# Patient Record
Sex: Female | Born: 1985
Health system: Southern US, Community
[De-identification: ages and names within clinical notes are randomized; demographics above are authoritative.]

## PROBLEM LIST (undated history)

## (undated) DIAGNOSIS — G43909 Migraine, unspecified, not intractable, without status migrainosus: Secondary | ICD-10-CM

## (undated) DIAGNOSIS — F3289 Other specified depressive episodes: Secondary | ICD-10-CM

## (undated) DIAGNOSIS — F319 Bipolar disorder, unspecified: Secondary | ICD-10-CM

## (undated) DIAGNOSIS — I10 Essential (primary) hypertension: Secondary | ICD-10-CM

## (undated) DIAGNOSIS — F17200 Nicotine dependence, unspecified, uncomplicated: Secondary | ICD-10-CM

## (undated) DIAGNOSIS — A6000 Herpesviral infection of urogenital system, unspecified: Secondary | ICD-10-CM

## (undated) DIAGNOSIS — F329 Major depressive disorder, single episode, unspecified: Secondary | ICD-10-CM

## (undated) DIAGNOSIS — Z9189 Other specified personal risk factors, not elsewhere classified: Secondary | ICD-10-CM

## (undated) HISTORY — DX: Herpesviral infection of urogenital system, unspecified: A60.00

## (undated) HISTORY — PX: NO PAST SURGERIES: SHX2092

## (undated) HISTORY — DX: Bipolar disorder, unspecified: F31.9

## (undated) HISTORY — DX: Other specified personal risk factors, not elsewhere classified: Z91.89

## (undated) HISTORY — DX: Other specified depressive episodes: F32.89

## (undated) HISTORY — DX: Nicotine dependence, unspecified, uncomplicated: F17.200

## (undated) HISTORY — DX: Major depressive disorder, single episode, unspecified: F32.9

## (undated) HISTORY — DX: Migraine, unspecified, not intractable, without status migrainosus: G43.909

## (undated) HISTORY — DX: Essential (primary) hypertension: I10

---
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
## 2004-04-05 ENCOUNTER — Emergency Department (HOSPITAL_COMMUNITY): Admission: EM | Admit: 2004-04-05 | Discharge: 2004-04-05 | Payer: Self-pay | Admitting: Family Medicine

## 2004-11-02 ENCOUNTER — Emergency Department (HOSPITAL_COMMUNITY): Admission: EM | Admit: 2004-11-02 | Discharge: 2004-11-02 | Payer: Self-pay | Admitting: Emergency Medicine

## 2005-02-05 ENCOUNTER — Emergency Department (HOSPITAL_COMMUNITY): Admission: EM | Admit: 2005-02-05 | Discharge: 2005-02-05 | Payer: Self-pay | Admitting: Emergency Medicine

## 2005-11-08 ENCOUNTER — Emergency Department (HOSPITAL_COMMUNITY): Admission: EM | Admit: 2005-11-08 | Discharge: 2005-11-08 | Payer: Self-pay | Admitting: Family Medicine

## 2006-10-12 IMAGING — CR DG FINGER LITTLE 2+V*L*
3 series · 3 of 3 positions shown · non-contrast
Comparison: none

CLINICAL DATA: Trauma

Left little finger three-view:
Fracture at the dorsal aspect of the base distal phalanx, distracted
approximately 1 mm. There is intra-articular extension of the fracture line with
minimal step-off deformity. No other bone abnormalities are seen.

[view not recorded (1 of 3)]
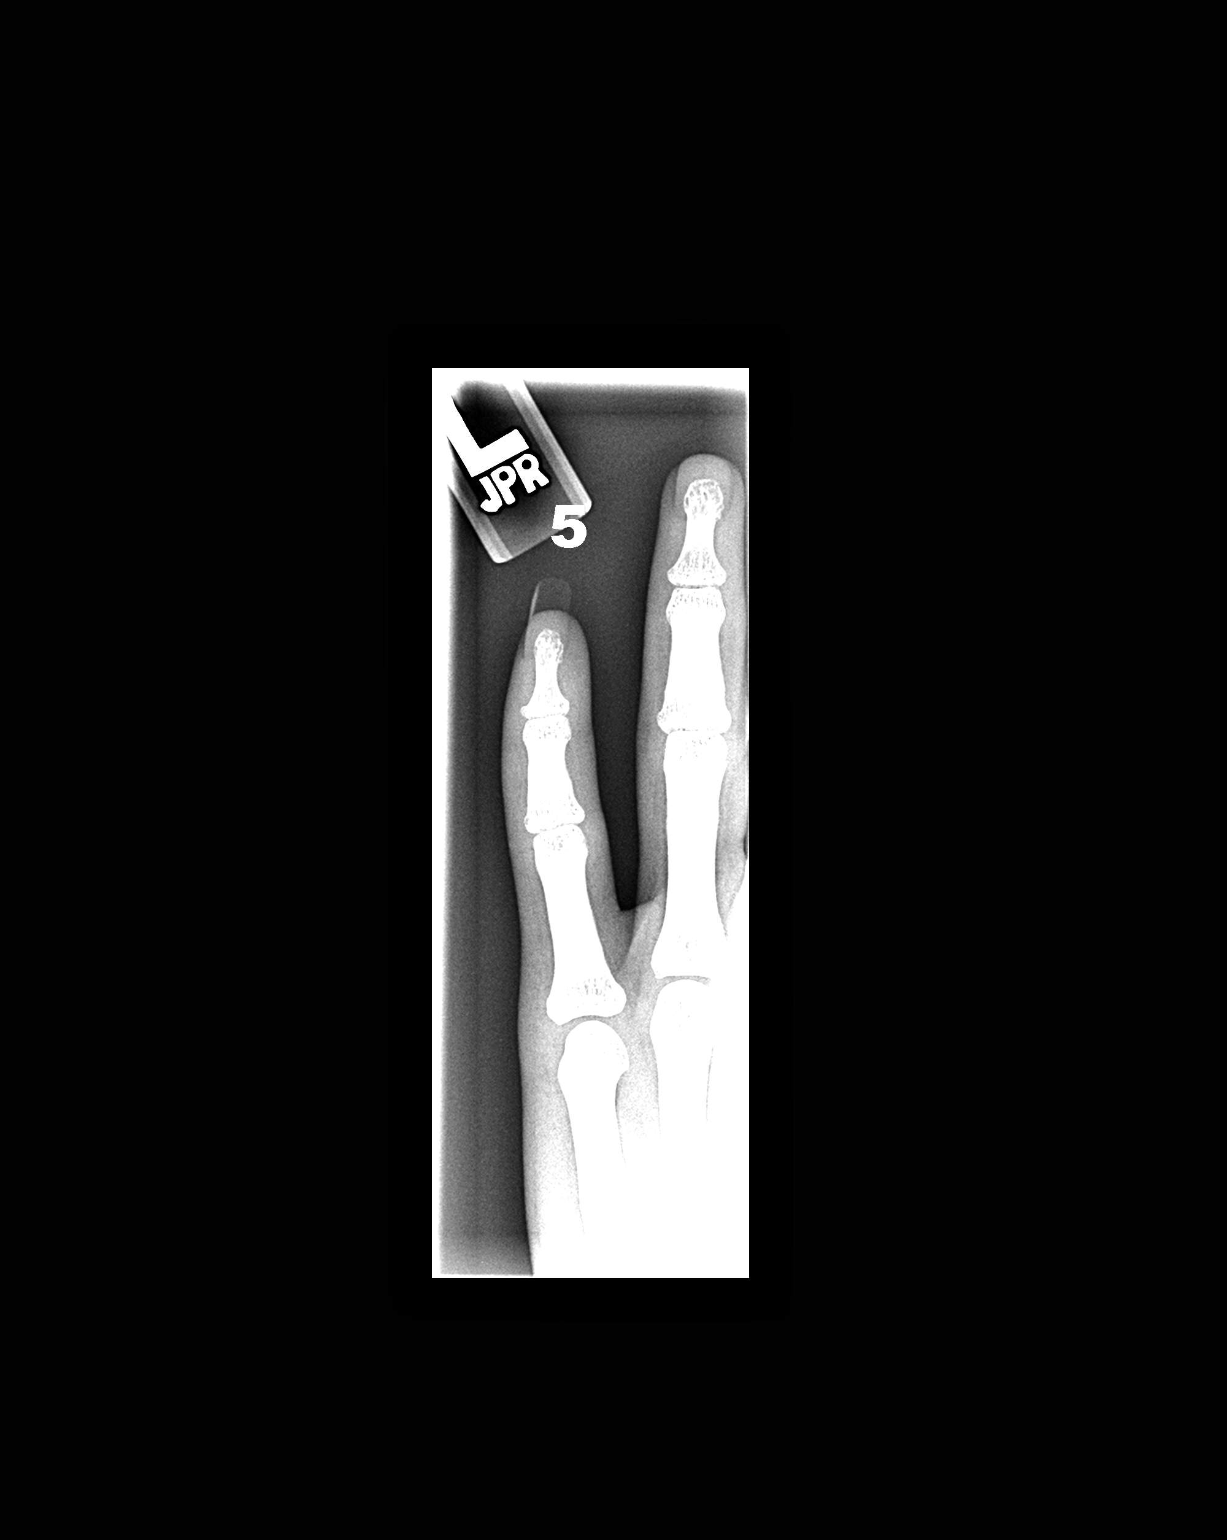

[view not recorded (2 of 3)]
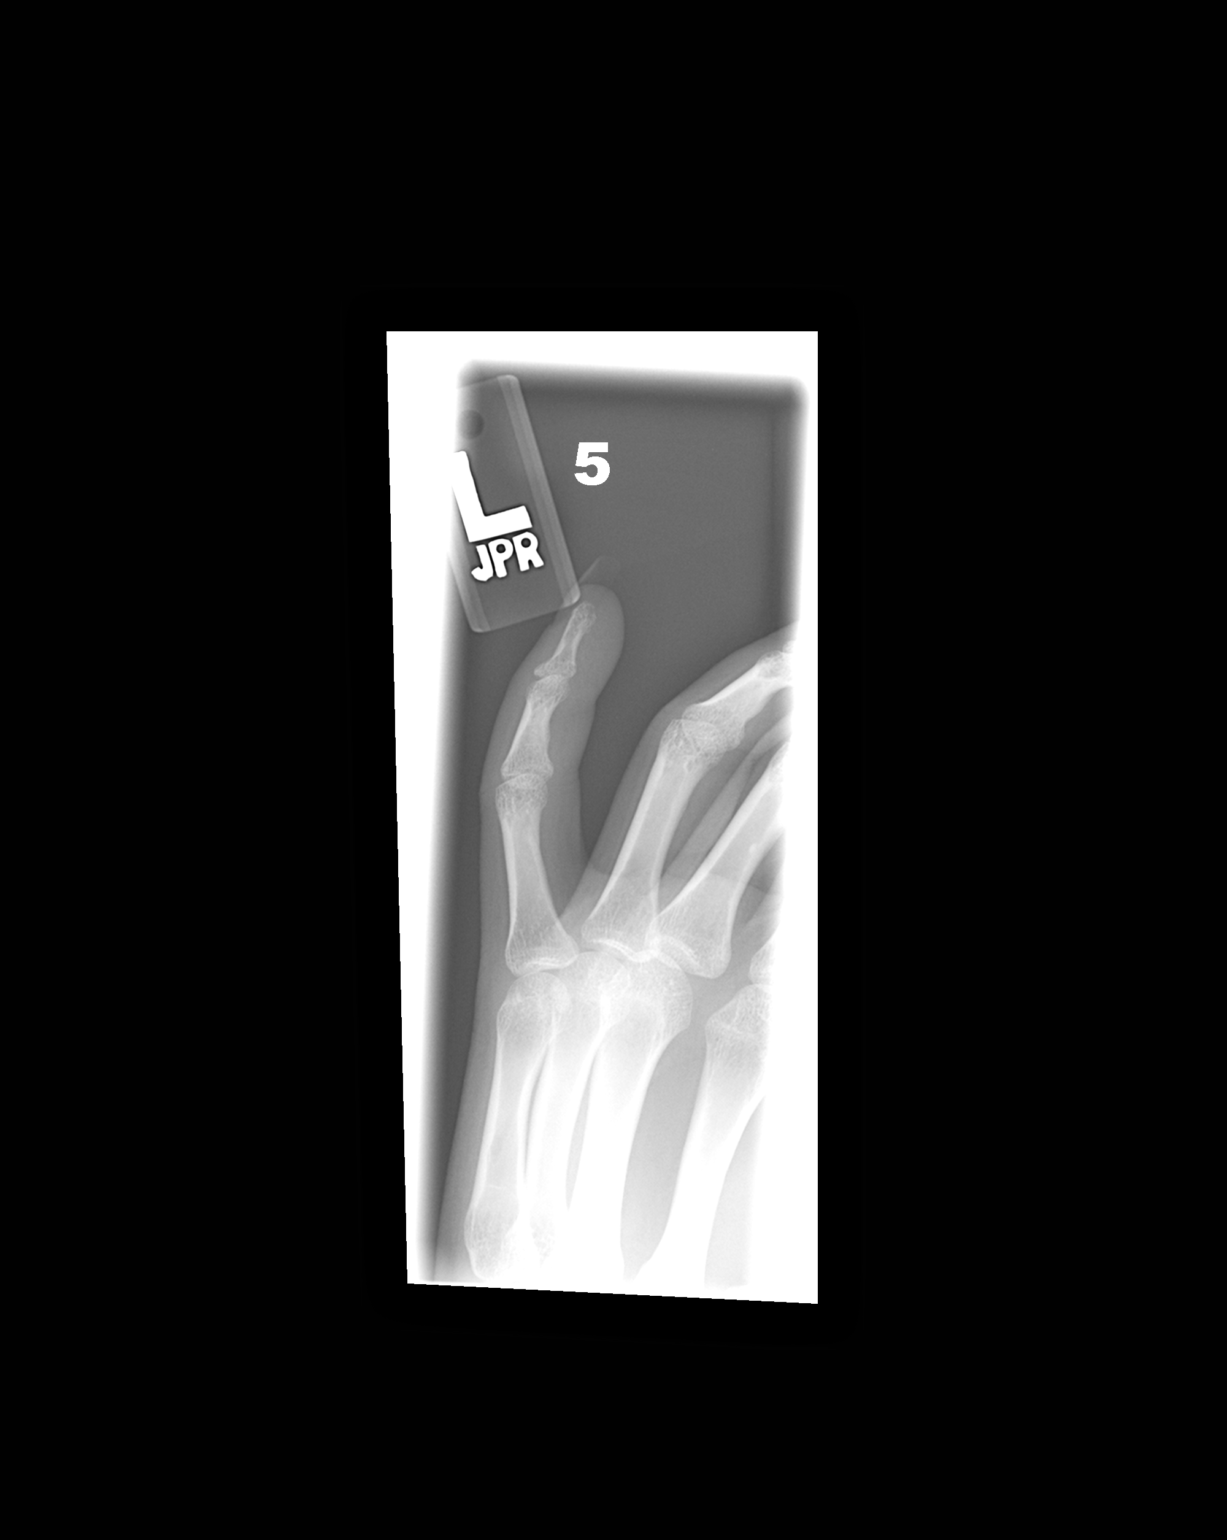

[view not recorded (3 of 3)]
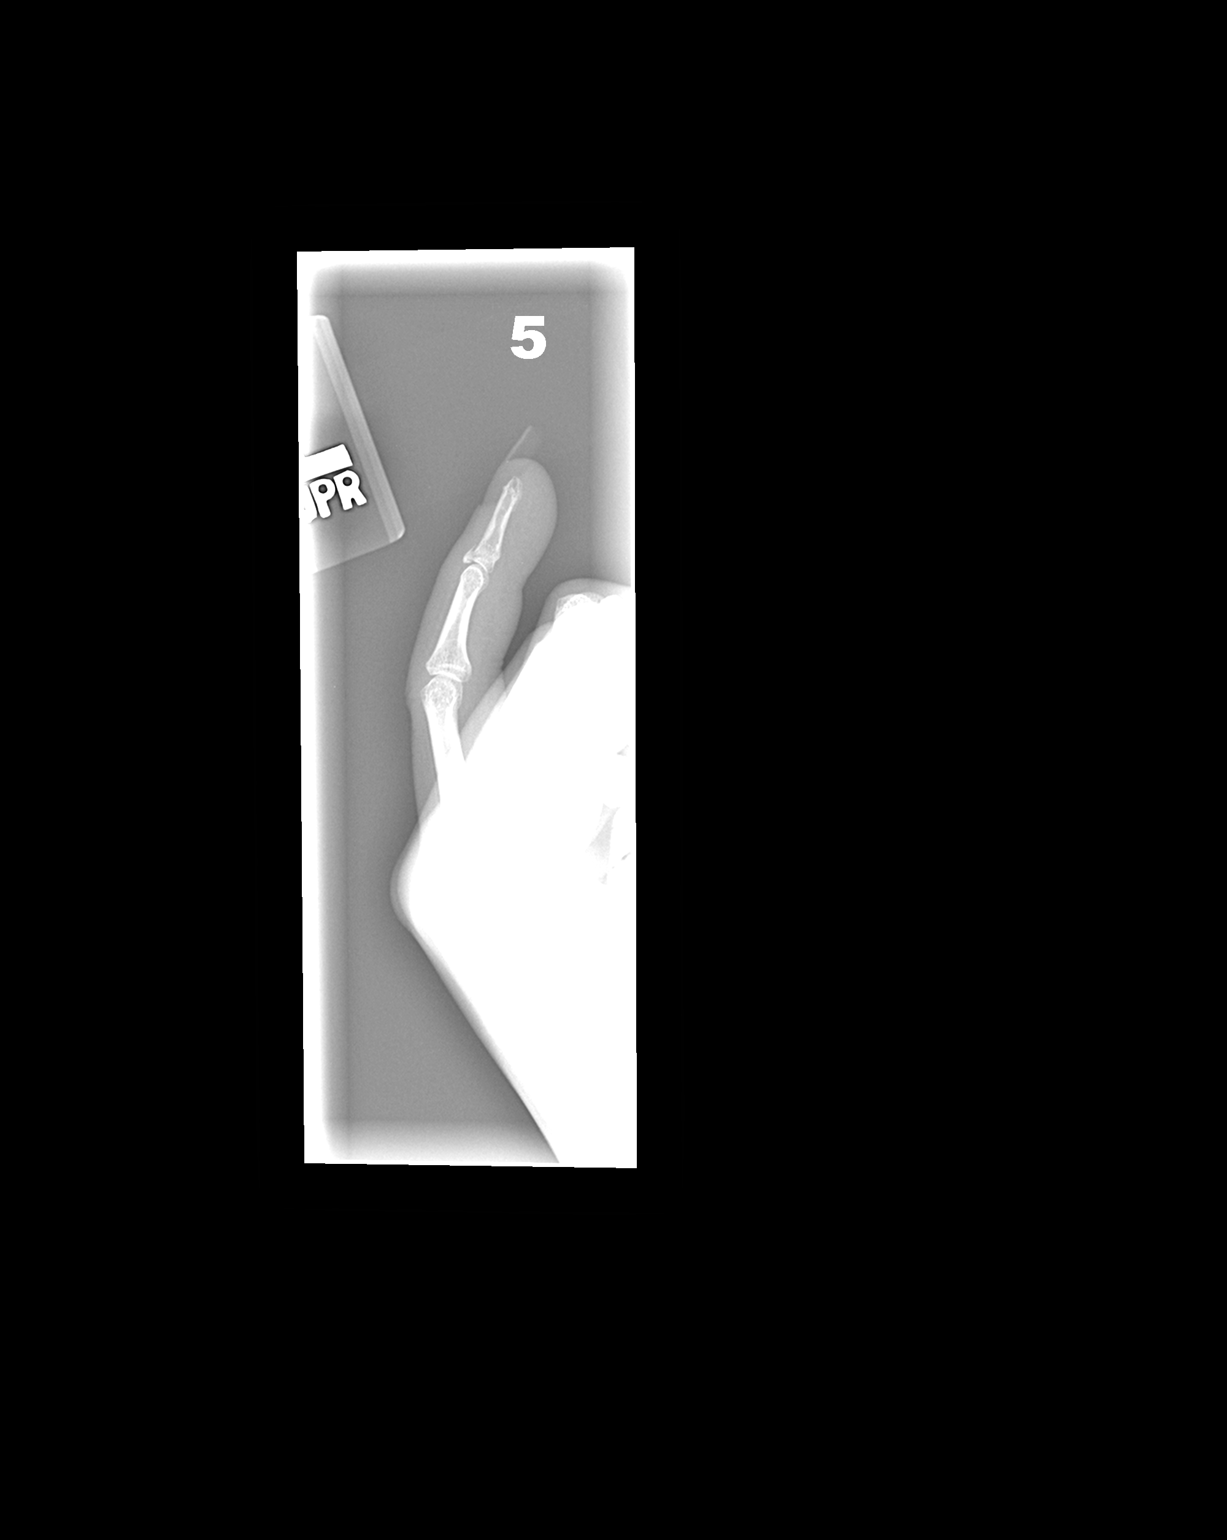

[3 of 3 positions shown; findings below may reference images not displayed]

IMPRESSION: 1. Interarticular fracture, dorsal aspect base distal phalanx left little finger

## 2006-11-01 ENCOUNTER — Emergency Department (HOSPITAL_COMMUNITY): Admission: EM | Admit: 2006-11-01 | Discharge: 2006-11-01 | Payer: Self-pay | Admitting: Family Medicine

## 2006-11-04 ENCOUNTER — Emergency Department (HOSPITAL_COMMUNITY): Admission: EM | Admit: 2006-11-04 | Discharge: 2006-11-04 | Payer: Self-pay | Admitting: Family Medicine

## 2007-02-01 ENCOUNTER — Emergency Department (HOSPITAL_COMMUNITY): Admission: EM | Admit: 2007-02-01 | Discharge: 2007-02-01 | Payer: Self-pay | Admitting: Family Medicine

## 2007-10-11 ENCOUNTER — Emergency Department (HOSPITAL_COMMUNITY): Admission: EM | Admit: 2007-10-11 | Discharge: 2007-10-11 | Payer: Self-pay | Admitting: Emergency Medicine

## 2008-11-14 ENCOUNTER — Other Ambulatory Visit: Admission: RE | Admit: 2008-11-14 | Discharge: 2008-11-14 | Payer: Self-pay | Admitting: Obstetrics and Gynecology

## 2008-11-14 ENCOUNTER — Encounter: Payer: Self-pay | Admitting: Internal Medicine

## 2008-11-14 LAB — CONVERTED CEMR LAB: Pap Smear: NEGATIVE

## 2009-09-11 ENCOUNTER — Ambulatory Visit: Payer: Self-pay | Admitting: Internal Medicine

## 2009-09-11 DIAGNOSIS — A6000 Herpesviral infection of urogenital system, unspecified: Secondary | ICD-10-CM | POA: Insufficient documentation

## 2009-09-11 DIAGNOSIS — I10 Essential (primary) hypertension: Secondary | ICD-10-CM | POA: Insufficient documentation

## 2009-09-11 DIAGNOSIS — F17200 Nicotine dependence, unspecified, uncomplicated: Secondary | ICD-10-CM | POA: Insufficient documentation

## 2009-09-11 DIAGNOSIS — Z9189 Other specified personal risk factors, not elsewhere classified: Secondary | ICD-10-CM | POA: Insufficient documentation

## 2009-09-11 DIAGNOSIS — F319 Bipolar disorder, unspecified: Secondary | ICD-10-CM | POA: Insufficient documentation

## 2009-09-11 DIAGNOSIS — F329 Major depressive disorder, single episode, unspecified: Secondary | ICD-10-CM | POA: Insufficient documentation

## 2009-09-11 DIAGNOSIS — F3289 Other specified depressive episodes: Secondary | ICD-10-CM | POA: Insufficient documentation

## 2009-09-11 DIAGNOSIS — G43909 Migraine, unspecified, not intractable, without status migrainosus: Secondary | ICD-10-CM | POA: Insufficient documentation

## 2009-09-17 ENCOUNTER — Encounter: Payer: Self-pay | Admitting: Internal Medicine

## 2009-09-17 LAB — CONVERTED CEMR LAB
ALT: 18 units/L (ref 0–35)
AST: 24 units/L (ref 0–37)
Albumin: 4.2 g/dL (ref 3.5–5.2)
Alkaline Phosphatase: 43 units/L (ref 39–117)
BUN: 11 mg/dL (ref 6–23)
Basophils Absolute: 0 10*3/uL (ref 0.0–0.1)
Basophils Relative: 0.1 % (ref 0.0–3.0)
Bilirubin, Direct: 0.1 mg/dL (ref 0.0–0.3)
CO2: 28 meq/L (ref 19–32)
Calcium: 9.3 mg/dL (ref 8.4–10.5)
Chloride: 109 meq/L (ref 96–112)
Cholesterol: 166 mg/dL (ref 0–200)
Creatinine, Ser: 0.9 mg/dL (ref 0.4–1.2)
Eosinophils Absolute: 0.1 10*3/uL (ref 0.0–0.7)
Eosinophils Relative: 1.1 % (ref 0.0–5.0)
GFR calc non Af Amer: 98.65 mL/min (ref >60)
Glucose, Bld: 84 mg/dL (ref 70–99)
HCT: 41.3 % (ref 36.0–46.0)
HDL: 45.8 mg/dL (ref >39.00)
Hemoglobin: 14.1 g/dL (ref 12.0–15.0)
LDL Cholesterol: 110 mg/dL — ABNORMAL HIGH (ref 0–99)
Lymphocytes Relative: 40.1 % (ref 12.0–46.0)
Lymphs Abs: 2.5 10*3/uL (ref 0.7–4.0)
MCHC: 34.2 g/dL (ref 30.0–36.0)
MCV: 97 fL (ref 78.0–100.0)
Monocytes Absolute: 0.3 10*3/uL (ref 0.1–1.0)
Monocytes Relative: 4.7 % (ref 3.0–12.0)
Neutro Abs: 3.3 10*3/uL (ref 1.4–7.7)
Neutrophils Relative %: 54 % (ref 43.0–77.0)
Platelets: 125 10*3/uL — ABNORMAL LOW (ref 150.0–400.0)
Potassium: 4.1 meq/L (ref 3.5–5.1)
RBC: 4.26 M/uL (ref 3.87–5.11)
RDW: 13.2 % (ref 11.5–14.6)
Sodium: 145 meq/L (ref 135–145)
TSH: 0.61 microintl units/mL (ref 0.35–5.50)
Total Bilirubin: 0.9 mg/dL (ref 0.3–1.2)
Total CHOL/HDL Ratio: 4
Total Protein: 6.8 g/dL (ref 6.0–8.3)
Triglycerides: 51 mg/dL (ref 0.0–149.0)
VLDL: 10.2 mg/dL (ref 0.0–40.0)
WBC: 6.2 10*3/uL (ref 4.5–10.5)

## 2009-10-24 ENCOUNTER — Other Ambulatory Visit: Admission: RE | Admit: 2009-10-24 | Discharge: 2009-10-24 | Payer: Self-pay | Admitting: Obstetrics and Gynecology

## 2009-10-24 ENCOUNTER — Emergency Department (HOSPITAL_COMMUNITY): Admission: EM | Admit: 2009-10-24 | Discharge: 2009-10-24 | Payer: Self-pay | Admitting: Family Medicine

## 2010-03-12 ENCOUNTER — Ambulatory Visit: Payer: Self-pay | Admitting: Internal Medicine

## 2010-03-12 DIAGNOSIS — N644 Mastodynia: Secondary | ICD-10-CM | POA: Insufficient documentation

## 2010-03-13 ENCOUNTER — Encounter: Admission: RE | Admit: 2010-03-13 | Discharge: 2010-03-13 | Payer: Self-pay | Admitting: Internal Medicine

## 2010-05-03 ENCOUNTER — Telehealth: Payer: Self-pay | Admitting: Internal Medicine

## 2010-07-01 ENCOUNTER — Emergency Department (HOSPITAL_COMMUNITY): Admission: EM | Admit: 2010-07-01 | Discharge: 2010-07-01 | Payer: Self-pay | Admitting: Emergency Medicine

## 2010-10-29 ENCOUNTER — Telehealth (INDEPENDENT_AMBULATORY_CARE_PROVIDER_SITE_OTHER): Payer: Self-pay | Admitting: *Deleted

## 2010-11-04 ENCOUNTER — Emergency Department (HOSPITAL_COMMUNITY)
Admission: EM | Admit: 2010-11-04 | Discharge: 2010-11-04 | Payer: Self-pay | Source: Home / Self Care | Admitting: Emergency Medicine

## 2010-11-19 NOTE — Progress Notes (Signed)
Summary: BP reading  Phone Note Call from Patient Call back at Home Phone [phone]   Caller: Patient Summary of Call: Patient called requesting to know last couple of BP readings to see if she will qualify for insurance  premium discount through employer. Returned call to pt/ lmovm to call back Initial call taken by: Rock Nephew CMA,  May 03, 2010 4:15 PM  Follow-up for Phone Call        Patient notified and will fax form to my attn for her job.  Follow-up by: Lucious Groves CMA,  May 06, 2010 9:25 AM

## 2010-11-19 NOTE — Assessment & Plan Note (Signed)
Summary: R SORE BREAST   STC   Vital Signs:  Patient profile:   25 year old female Height:      62.5 inches (158.75 cm) Weight:      151.4 pounds (68.82 kg) O2 Sat:      96 % on Room air Temp:     99.1 degrees F (37.28 degrees C) oral Pulse rate:   105 / minute BP sitting:   118 / 68  (left arm) Cuff size:   regular  Vitals Entered By: Orlan Leavens (Mar 12, 2010 2:41 PM)  O2 Flow:  Room air CC:  (R) breast sore Is Patient Diabetic? No Pain Assessment Patient in pain? no        Primary Care Provider:  Newt Lukes MD  CC:   (R) breast sore.  History of Present Illness: c/o pain in right breast - a/w increase size of breast - ?big mass under the nipple region - onset pain 3 days ago - but inc size last 2-3 weeks - stopped depo shots 3 months ago - no redness or skin change - no discharge from nipple - no fever  Current Medications (verified): 1)  Lamictal 100 Mg Tabs (Lamotrigine) .... Take 1 By Mouth Qd 2)  Depo-Provera 150 Mg/ml Susp (Medroxyprogesterone Acetate) .... Every 3 Mos At Ob-Gyn  Allergies (verified): No Known Drug Allergies  Past History:  Past Medical History: Depression Bipolar I Hypertension   MD rooster: gyn -[facility] - [facility]  Family History: Family History Diabetes 1st degree relative (grandparent) Family History Hypertension (parent & grandparent) Stroke (grandparent) emotional illness (parent)  Review of Systems  The patient denies anorexia, fever, weight loss, weight gain, vision loss, dyspnea on exertion, headaches, and depression.    Physical Exam  General:  alert, well-developed, well-nourished, and cooperative to examination.    Breasts:  slightly R>L breast with 2cm palp firm mass beneath right areola (tender to palp) - mobile - no dimpling or discharge Lungs:  normal respiratory effort, no intercostal retractions or use of accessory muscles; normal breath sounds bilaterally - no  crackles and no wheezes.    Heart:  normal rate, regular rhythm, no murmur, and no rub. BLE without edema.   Impression & Recommendations:  Problem # 1:  BREAST PAIN, RIGHT (ICD-611.71) inc size and pain likely related to hormonal changes as now off depoprovera x 68mo but has not yet resumed period - breast tissue appearsnormal but pt concerned about the mass - will send for [place] to further eval and provide reassurance - no evidence for infx so no abx Orders: Misc. Referral (Misc. Ref)  Complete Medication List: 1)  Lamictal 100 Mg Tabs (Lamotrigine) .... Take 1 by mouth qd 2)  Depo-provera 150 Mg/ml Susp (Medroxyprogesterone acetate) .... Every 3 mos at ob-gyn  Patient Instructions: 1)  it was good to see you today. 2)  we'll make referral for breast ultarsound to look for mass or problem causing the right breast pain. Our office will contact you regarding this appointment once made.  3)  the swelling and pain may be related to being off Depo shots and resuming your own normal hormone cycle- 4)  use Tylenol or Ibuprofen for pain or comfort -

## 2010-11-21 NOTE — Progress Notes (Signed)
  Phone Note Other Incoming   Request: Send information Summary of Call: Request for records received from [place] [facility]. Request forwarded to [employer].

## 2010-11-29 ENCOUNTER — Telehealth (INDEPENDENT_AMBULATORY_CARE_PROVIDER_SITE_OTHER): Payer: Self-pay | Admitting: *Deleted

## 2010-12-05 NOTE — Progress Notes (Signed)
  Phone Note Other Incoming   Request: Send information Summary of Call: Request for records received from [place] [facility]. Request forwarded to [employer].  07/02/07 to 07/01/2010-[place]

## 2011-06-09 ENCOUNTER — Encounter: Payer: Self-pay | Admitting: Internal Medicine

## 2011-06-11 ENCOUNTER — Telehealth: Payer: Self-pay

## 2011-06-11 DIAGNOSIS — Z0000 Encounter for general adult medical examination without abnormal findings: Secondary | ICD-10-CM

## 2011-06-11 NOTE — Telephone Encounter (Signed)
Lab order change

## 2011-06-12 ENCOUNTER — Other Ambulatory Visit: Payer: Self-pay

## 2011-06-19 ENCOUNTER — Encounter: Payer: Self-pay | Admitting: Internal Medicine

## 2012-02-05 ENCOUNTER — Ambulatory Visit (INDEPENDENT_AMBULATORY_CARE_PROVIDER_SITE_OTHER): Payer: 59 | Admitting: Obstetrics and Gynecology

## 2012-02-05 ENCOUNTER — Encounter: Payer: Self-pay | Admitting: Obstetrics and Gynecology

## 2012-02-05 VITALS — BP 110/74 | Temp 98.5°F | Wt 159.0 lb

## 2012-02-05 DIAGNOSIS — N898 Other specified noninflammatory disorders of vagina: Secondary | ICD-10-CM | POA: Insufficient documentation

## 2012-02-05 DIAGNOSIS — A599 Trichomoniasis, unspecified: Secondary | ICD-10-CM | POA: Insufficient documentation

## 2012-02-05 DIAGNOSIS — A64 Unspecified sexually transmitted disease: Secondary | ICD-10-CM

## 2012-02-05 LAB — POCT WET PREP (WET MOUNT): Trichomonas Wet Prep HPF POC: POSITIVE

## 2012-02-05 MED ORDER — METRONIDAZOLE 500 MG PO TABS: 2000.0000 mg | ORAL_TABLET | Freq: Once | ORAL | Status: AC

## 2012-02-05 NOTE — Progress Notes (Signed)
Vaginal Discharge/Discomfort/Itching  Subjective:   Laurie Camacho is an 26 y.o. woman who presents c/o 4 wk hx of white, creamy vaginal discharge.  NO itching, irritation or odor.  Objective: discharge white, creamy and no odor Vaginal discharge: pH 5.0 Vaginal lesions:  none  Wet prep: normal epithelial cells, lactobacilli and trichomonads OSOM BV: not done OSOM Trichomonas:  not done  Assessment: Trichomonas  Plan: Additional Tests:GC and Chlamydia genprobes  Medications: metronidazole Counseling:  diagnosis, lab results, treatment options, follow up plan, return instructions and partner treatment offered and accepted Follow-up: August 2013 for aex  Brandilyn Nanninga P MD 02/05/2012 12:15 PM

## 2012-02-05 NOTE — Progress Notes (Signed)
Flagyl Rx printed didn't have pharmacy info called to [facility]. Patient aware.

## 2012-02-05 NOTE — Patient Instructions (Signed)
Trichomoniasis Trichomoniasis is an infection, caused by the Trichomonas organism, that affects both women and men. In women, the outer female genitalia and the vagina are affected. In men, the penis is mainly affected, but the prostate and other reproductive organs can also be involved. Trichomoniasis is a sexually transmitted disease (STD) and is most often passed to another person through sexual contact. The majority of people who get trichomoniasis, do so from a sexual encounter and are also at risk for other STDs, including gonorrhea and chlamydia, hepatitis B, and HIV. Trichomoniasis can increase the risk for HIV and pregnancy problems. SYMPTOMS   Abnormal gray-green, frothy vaginal discharge in women.   Itching and irritation of the area outside the vagina in women.   Penile discharge with or without pain in males.   Inflammation of the urethra (urethritis), causing painful urination.   Bleeding after sexual intercourse.  HOME CARE INSTRUCTIONS   Take all medication prescribed by your caregiver.   Take over-the-counter medication for itching or irritation as directed by your caregiver.   Do not have sexual intercourse while you have the infection.   Do not douche or wear tampons while you have the infection.   Discuss the infection with your partner, as your partner may have acquired the infection from you. Or, your partner may have been the person who transmitted the infection to you.   Have your sex partner examined and treated if necessary.   Practice safe, informed, and protected sex.   See your caregiver for other STD testing.  SEEK MEDICAL CARE IF:  You still have symptoms after you finish the medication.   You have an oral temperature above 102 F (38.9 C).   You develop belly (abdominal) pain.   You have pain when you urinate.   You have bleeding after sexual intercourse.   You develop a rash.   The medication makes you sick or throw up (vomit).  Document  Released: 11/13/2004 Document Revised: 09/25/2011 Document Reviewed: 04/27/2009 Exit[employer] Patient Information 2012 [employer]. 

## 2012-02-06 LAB — GC/CHLAMYDIA PROBE AMP, GENITAL: Chlamydia, DNA Probe: NEGATIVE

## 2012-02-16 ENCOUNTER — Telehealth: Payer: Self-pay

## 2012-02-16 NOTE — Telephone Encounter (Signed)
Tc from pt. Req test results. Told pt GC/CT-both neg. Pt voices understanding.

## 2012-02-17 ENCOUNTER — Encounter: Payer: Self-pay | Admitting: Obstetrics and Gynecology

## 2012-02-18 ENCOUNTER — Telehealth: Payer: Self-pay | Admitting: Obstetrics and Gynecology

## 2012-02-18 NOTE — Telephone Encounter (Signed)
Pc from pt. Wanted to touch base with pt to make sure ATB's was received from last ov due to new e-pres system. The rx printed out that day. Pt states,"received medicine,". Pt will sched TOC x3wks from taking ATB's.

## 2012-02-18 NOTE — Telephone Encounter (Signed)
Lm on vm to cb per rx.

## 2012-02-18 NOTE — Telephone Encounter (Signed)
Routed to chandra 

## 2012-03-18 ENCOUNTER — Encounter: Payer: 59 | Admitting: Obstetrics and Gynecology

## 2012-04-21 ENCOUNTER — Telehealth: Payer: Self-pay | Admitting: *Deleted

## 2012-04-21 DIAGNOSIS — Z0000 Encounter for general adult medical examination without abnormal findings: Secondary | ICD-10-CM

## 2012-04-21 NOTE — Telephone Encounter (Signed)
Message copied by Deatra James on Wed Apr 21, 2012  4:28 PM ------      Message from: Burnett Harry      Created: Wed Apr 21, 2012  4:13 PM      Regarding: CPE  05/10/2012       [facility]

## 2012-04-21 NOTE — Telephone Encounter (Signed)
Received staff msg pt made cpx 05/10/12. Need labs entered... 04/21/12@4 :28pm/LB

## 2012-05-05 ENCOUNTER — Ambulatory Visit (INDEPENDENT_AMBULATORY_CARE_PROVIDER_SITE_OTHER): Payer: 59 | Admitting: Obstetrics and Gynecology

## 2012-05-05 ENCOUNTER — Encounter: Payer: Self-pay | Admitting: Obstetrics and Gynecology

## 2012-05-05 VITALS — BP 118/76 | Temp 99.0°F | Ht 62.5 in | Wt 165.0 lb

## 2012-05-05 DIAGNOSIS — Z309 Encounter for contraceptive management, unspecified: Secondary | ICD-10-CM

## 2012-05-05 DIAGNOSIS — IMO0001 Reserved for inherently not codable concepts without codable children: Secondary | ICD-10-CM

## 2012-05-05 DIAGNOSIS — N898 Other specified noninflammatory disorders of vagina: Secondary | ICD-10-CM

## 2012-05-05 LAB — POCT WET PREP (WET MOUNT)
Clue Cells Wet Prep Whiff POC: POSITIVE
pH: 5.5

## 2012-05-05 MED ORDER — MEDROXYPROGESTERONE ACETATE 150 MG/ML IM SUSP: 150.0000 mg | INTRAMUSCULAR | Status: DC

## 2012-05-05 NOTE — Progress Notes (Signed)
SUBJECTIVE: Pt here today for TOC of trich. Pt c/o white vaginal d/c. No odor or irritation. Pt admits to missing bcp's frequently and wants to consider Depo Provera again for birth control. Denies unprotected IC. Last BCPs about 2 weeks ago.  No IC since  OBJECTIVE: BP 118/76  Temp 99 F (37.2 C)  Ht 5' 2.5" (1.588 m)  Wt 165 lb (74.844 kg)  BMI 29.70 kg/m2  LMP 04/21/2012  Pelvic: Vulva: NL  Vagina: White Discharge Wet Prep: PH5.0   No trich  IMPRESSION: No evidence of trich Needs BC  RECOMMENDATIONS: Pt wants to restart Depo Provera, script sent to pharmacy to start ASAP. No IC until then. FU for aex

## 2012-05-10 ENCOUNTER — Other Ambulatory Visit (INDEPENDENT_AMBULATORY_CARE_PROVIDER_SITE_OTHER): Payer: 59

## 2012-05-10 ENCOUNTER — Ambulatory Visit (INDEPENDENT_AMBULATORY_CARE_PROVIDER_SITE_OTHER): Payer: 59 | Admitting: Internal Medicine

## 2012-05-10 ENCOUNTER — Encounter: Payer: Self-pay | Admitting: Internal Medicine

## 2012-05-10 VITALS — BP 110/72 | HR 86 | Temp 98.6°F | Ht 62.5 in | Wt 164.0 lb

## 2012-05-10 DIAGNOSIS — F17200 Nicotine dependence, unspecified, uncomplicated: Secondary | ICD-10-CM

## 2012-05-10 DIAGNOSIS — Z3009 Encounter for other general counseling and advice on contraception: Secondary | ICD-10-CM

## 2012-05-10 DIAGNOSIS — Z0000 Encounter for general adult medical examination without abnormal findings: Secondary | ICD-10-CM

## 2012-05-10 LAB — CBC WITH DIFFERENTIAL/PLATELET
Basophils Relative: 0.4 % (ref 0.0–3.0)
Eosinophils Absolute: 0.2 10*3/uL (ref 0.0–0.7)
Eosinophils Relative: 2.2 % (ref 0.0–5.0)
Lymphocytes Relative: 21.7 % (ref 12.0–46.0)
Monocytes Relative: 4.5 % (ref 3.0–12.0)
Neutrophils Relative %: 71.2 % (ref 43.0–77.0)
RBC: 4.58 Mil/uL (ref 3.87–5.11)
WBC: 8.8 10*3/uL (ref 4.5–10.5)

## 2012-05-10 LAB — URINALYSIS, ROUTINE W REFLEX MICROSCOPIC
Ketones, ur: NEGATIVE
Specific Gravity, Urine: 1.025 (ref 1.000–1.030)
Urine Glucose: NEGATIVE
pH: 6 (ref 5.0–8.0)

## 2012-05-10 LAB — BASIC METABOLIC PANEL
BUN: 13 mg/dL (ref 6–23)
Chloride: 108 mEq/L (ref 96–112)
Creatinine, Ser: 0.7 mg/dL (ref 0.4–1.2)
Glucose, Bld: 91 mg/dL (ref 70–99)
Potassium: 4.1 mEq/L (ref 3.5–5.1)

## 2012-05-10 LAB — HEPATIC FUNCTION PANEL
Alkaline Phosphatase: 58 U/L (ref 39–117)
Bilirubin, Direct: 0.1 mg/dL (ref 0.0–0.3)
Total Protein: 7.1 g/dL (ref 6.0–8.3)

## 2012-05-10 LAB — LIPID PANEL
Cholesterol: 213 mg/dL — ABNORMAL HIGH (ref 0–200)
HDL: 55.2 mg/dL (ref >39.00)

## 2012-05-10 MED ORDER — MEDROXYPROGESTERONE ACETATE 150 MG/ML IM SUSP
150.0000 mg | Freq: Once | INTRAMUSCULAR | Status: AC
  Administered 2012-05-10: 150 mg via INTRAMUSCULAR

## 2012-05-10 NOTE — Progress Notes (Unsigned)
Next Depo due 08-01-2012 

## 2012-05-10 NOTE — Patient Instructions (Signed)
It was good to see you today. Health Maintenance reviewed - please schedule nurse visit for your Tetanus booster update - all other recommended immunizations and age-appropriate screenings are up-to-date.  Test(s) ordered today. Your results will be called to you after review (48-72hours after test completion). If any changes need to be made, you will be notified at that time. We will complete your employer health form after review of lab results and fax as requested Work on lifestyle changes as discussed (low fat, low carb, increased protein diet; improved exercise efforts; weight loss) to control sugar, blood pressure and cholesterol levels and/or reduce risk of developing other medical problems. Look into Limit[employer]cy or other type of food journal to assist you in this process. Continue to think about giving up cigarettes! Use nicotine gum, nicotine patches or electronic cigarettes. If you're interested in medication to help you quit, please call  - let me know how I can help! Please schedule followup in 1-2 years for medical physical and labs, call sooner if problems.

## 2012-05-10 NOTE — Assessment & Plan Note (Signed)
5 minutes today spent counseling patient on unhealthy effects of continued tobacco abuse and encouragement of cessation including medical options available to help the patient quit smoking. She has nicotine patches at home -she is encouraged to use these

## 2012-05-10 NOTE — Progress Notes (Signed)
Subjective:    Patient ID: Laurie Camacho, female    DOB: 12-20-1985, 26 y.o.   MRN: [id number]  HPI  patient is here today for annual physical. Patient feels well and has no complaints. Needs form completed for employer's wellness screening  Past Medical History  Diagnosis Date  . BIPOLAR AFFECTIVE DISORDER   . CHICKENPOX, HX OF   . DEPRESSION   . GENITAL HERPES   . HYPERTENSION   . MIGRAINE HEADACHE   . SMOKER    Family History  Problem Relation Age of Onset  . Diabetes Other   . Hypertension Other   . Stroke Other    History  Substance Use Topics  . Smoking status: Current Everyday Smoker -- 1.0 packs/day    Types: Cigarettes  . Smokeless tobacco: Current User   Comment: Single- lives with domestic partner. 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 Alcohol Use: No     Review of Systems Constitutional: Negative for fever or unexpected weight change.  Respiratory: Negative for cough and shortness of breath.   Cardiovascular: Negative for chest pain or palpitations.  Gastrointestinal: Negative for abdominal pain, no bowel changes.  Musculoskeletal: Negative for gait problem or joint swelling.  Skin: Negative for rash.  Neurological: Negative for dizziness or headache.  No other specific complaints in a complete review of systems (except as listed in HPI above).     Objective:   Physical Exam BP 110/72  Pulse 86  Temp 98.6 F (37 C) (Oral)  Ht 5' 2.5" (1.588 m)  Wt 164 lb (74.39 kg)  BMI 29.52 kg/m2  SpO2 95%  LMP 04/21/2012 Wt Readings from Last 3 Encounters:  05/10/12 164 lb (74.39 kg)  05/05/12 165 lb (74.844 kg)  02/05/12 159 lb (72.122 kg)   Constitutional: She is overweight, but appears well-developed and well-nourished. No distress.  HENT: Head: Normocephalic and atraumatic. Ears: B TMs ok, no erythema or effusion; Nose: Nose normal. Mouth/Throat: Oropharynx is clear and moist. No oropharyngeal exudate.  Eyes: Conjunctivae and EOM are normal.  Pupils are equal, round, and reactive to light. No scleral icterus.  Neck: Normal range of motion. Neck supple. No JVD present. No thyromegaly present.  Cardiovascular: Normal rate, regular rhythm and normal heart sounds.  No murmur heard. No BLE edema. Pulmonary/Chest: Effort normal and breath sounds normal. No respiratory distress. She has no wheezes.  Abdominal: Soft. Bowel sounds are normal. She exhibits no distension. There is no tenderness. no masses GU: defer to gyn Musculoskeletal:  R foot with mild bunion deformity - Normal range of motion, no joint effusions. No other gross deformities Neurological: She is alert and oriented to person, place, and time. No cranial nerve deficit. Coordination normal.  Skin: various tattoos - Skin is warm and dry. No rash noted. No erythema.  Psychiatric: She has a normal mood and affect. Her behavior is normal. Judgment and thought content normal.   Lab Results  Component Value Date   WBC 6.2 09/11/2009   HGB 14.1 09/11/2009   HCT 41.3 09/11/2009   PLT 125.0* 09/11/2009   GLUCOSE 84 09/11/2009   CHOL 166 09/11/2009   TRIG 51.0 09/11/2009   HDL 45.80 09/11/2009   LDLCALC 110* 09/11/2009   ALT 18 09/11/2009   AST 24 09/11/2009   NA 145 09/11/2009   K 4.1 09/11/2009   CL 109 09/11/2009   CREATININE 0.9 09/11/2009   BUN 11 09/11/2009   CO2 28 09/11/2009   TSH 0.61 09/11/2009  Assessment & Plan:  CPX/v70.0 - Patient has been counseled on age-appropriate routine health concerns for screening and prevention. These are reviewed and up-to-date. Immunizations are up-to-date or declined. Labs ordered and will be reviewed. Form for employer to be completed as requested after lab review

## 2012-05-24 ENCOUNTER — Telehealth: Payer: Self-pay | Admitting: Obstetrics and Gynecology

## 2012-05-24 NOTE — Telephone Encounter (Signed)
Dr. Haygood/clarification on treatment.

## 2012-05-27 MED ORDER — FLUCONAZOLE 150 MG PO TABS: 150.0000 mg | ORAL_TABLET | Freq: Once | ORAL | Status: AC

## 2012-05-27 MED ORDER — METRONIDAZOLE 0.75 % VA GEL: VAGINAL | Status: DC

## 2012-05-27 NOTE — Telephone Encounter (Signed)
Tc to pt per vph recs. Pt voices understanding and wants to try meds offered. Rx for Metrogel and Diflucan on file e-pres to pharm on file.

## 2012-05-27 NOTE — Telephone Encounter (Signed)
No treatment was recommended since pt did not complain of sx referrable to BV.  She was listed as needing a TOC for trichomonas and both tests were negative for that.  If she is having sx (ie discharge with odor) we will send Metrogel for 7 days and a single Diflucan to be used after the last day of Metroget. BV can be a recurrent problem so it is not always treated if there are no sx except in pregnancy.

## 2012-06-09 ENCOUNTER — Ambulatory Visit: Payer: 59 | Admitting: Obstetrics and Gynecology

## 2012-07-01 ENCOUNTER — Encounter: Payer: 59 | Admitting: Obstetrics and Gynecology

## 2012-07-15 ENCOUNTER — Telehealth: Payer: Self-pay | Admitting: Obstetrics and Gynecology

## 2012-07-15 NOTE — Telephone Encounter (Signed)
Spoke with pt rgd msg pt wants to have cholesterol checked advised pt to contact pcp pt voice understanding

## 2012-07-16 ENCOUNTER — Ambulatory Visit (INDEPENDENT_AMBULATORY_CARE_PROVIDER_SITE_OTHER): Payer: 59 | Admitting: Internal Medicine

## 2012-07-16 ENCOUNTER — Encounter: Payer: Self-pay | Admitting: Internal Medicine

## 2012-07-16 ENCOUNTER — Other Ambulatory Visit (INDEPENDENT_AMBULATORY_CARE_PROVIDER_SITE_OTHER): Payer: 59

## 2012-07-16 VITALS — BP 106/72 | HR 86 | Temp 98.5°F | Ht 62.5 in | Wt 160.0 lb

## 2012-07-16 DIAGNOSIS — E785 Hyperlipidemia, unspecified: Secondary | ICD-10-CM | POA: Insufficient documentation

## 2012-07-16 LAB — LIPID PANEL
LDL Cholesterol: 113 mg/dL — ABNORMAL HIGH (ref 0–99)
Total CHOL/HDL Ratio: 4
Triglycerides: 56 mg/dL (ref 0.0–149.0)

## 2012-07-16 NOTE — Progress Notes (Signed)
  Subjective:    Patient ID: Laurie Camacho, female    DOB: 12/08/1985, 26 y.o.   MRN: [id number]  HPI  Here for followup, needs completion of insurance forms and recheck cholesterol to see if LDL<130 Has changed diet and exercise - weight loss reviewed  Review of Systems  Constitutional: Negative for fatigue and unexpected weight change.  Respiratory: Negative for cough and shortness of breath.   Cardiovascular: Negative for chest pain and palpitations.       Objective:   Physical Exam BP 106/72  Pulse 86  Temp 98.5 F (36.9 C) (Oral)  Ht 5' 2.5" (1.588 m)  Wt 160 lb (72.576 kg)  BMI 28.80 kg/m2  SpO2 95% Wt Readings from Last 3 Encounters:  07/16/12 160 lb (72.576 kg)  05/10/12 164 lb (74.39 kg)  05/05/12 165 lb (74.844 kg)   Constitutional: She appears well-developed and well-nourished. No distress.  Cardiovascular: Normal rate, regular rhythm and normal heart sounds.  No murmur heard. No BLE edema. Pulmonary/Chest: Effort normal and breath sounds normal. No respiratory distress. She has no wheezes.  Psychiatric: She has a normal mood and affect. Her behavior is normal. Judgment and thought content normal.   Lab Results  Component Value Date   WBC 8.8 05/10/2012   HGB 14.1 05/10/2012   HCT 42.4 05/10/2012   PLT 173.0 05/10/2012   GLUCOSE 91 05/10/2012   CHOL 213* 05/10/2012   TRIG 57.0 05/10/2012   HDL 55.20 05/10/2012   LDLDIRECT 142.0 05/10/2012   LDLCALC 110* 09/11/2009   ALT 13 05/10/2012   AST 15 05/10/2012   NA 139 05/10/2012   K 4.1 05/10/2012   CL 108 05/10/2012   CREATININE 0.7 05/10/2012   BUN 13 05/10/2012   CO2 24 05/10/2012   TSH 0.66 05/10/2012       Assessment & Plan:  See problem list. Medications and labs reviewed today.

## 2012-07-16 NOTE — Patient Instructions (Signed)
It was good to see you today. Test(s) ordered today. Your results will be called to you after review (48-72hours after test completion). If any changes need to be made, you will be notified at that time. We will complete your employer health form after review of lab results and fax as requested Continue to work on lifestyle changes as discussed (low fat, low carb, increased protein diet; improved exercise efforts; weight loss) to control sugar, blood pressure and cholesterol levels and/or reduce risk of developing other medical problems.  Continue to think about giving up cigarettes! Use nicotine gum, nicotine patches or electronic cigarettes. If you're interested in medication to help you quit, please call  - let me know how I can help! Please schedule followup in 1-2 years for medical physical and labs, call sooner if problems.

## 2012-07-16 NOTE — Assessment & Plan Note (Signed)
Will recheck LDL now to see if less than 130 following improvement in diet and exercise efforts Intentional weight loss of over 10 pounds reported Will complete insurance form after lab review

## 2012-07-22 ENCOUNTER — Telehealth: Payer: Self-pay | Admitting: Obstetrics and Gynecology

## 2012-07-22 NOTE — Telephone Encounter (Signed)
Tc to pt per telephone call. Told pt next Depo Provera due 08/01/12. Pt agrees.

## 2012-07-30 ENCOUNTER — Other Ambulatory Visit: Payer: 59

## 2012-08-02 ENCOUNTER — Other Ambulatory Visit: Payer: 59

## 2012-08-03 ENCOUNTER — Other Ambulatory Visit: Payer: Self-pay | Admitting: Obstetrics and Gynecology

## 2012-08-04 ENCOUNTER — Telehealth: Payer: Self-pay | Admitting: Obstetrics and Gynecology

## 2012-08-04 NOTE — Telephone Encounter (Signed)
TC to pt.  States is having recurrent vaginal irritation with possible infection.Was treated one month ago.  Sched for eval with SL 08/06/12 to accommocate pt's schedule.

## 2012-08-06 ENCOUNTER — Encounter: Payer: 59 | Admitting: Obstetrics and Gynecology

## 2012-08-06 ENCOUNTER — Other Ambulatory Visit (INDEPENDENT_AMBULATORY_CARE_PROVIDER_SITE_OTHER): Payer: 59

## 2012-08-06 DIAGNOSIS — Z3009 Encounter for other general counseling and advice on contraception: Secondary | ICD-10-CM

## 2012-08-06 MED ORDER — MEDROXYPROGESTERONE ACETATE 150 MG/ML IM SUSP
150.0000 mg | Freq: Once | INTRAMUSCULAR | Status: AC
  Administered 2012-08-06: 150 mg via INTRAMUSCULAR

## 2012-08-06 NOTE — Progress Notes (Unsigned)
Next Depo due 10-29-2011

## 2012-10-29 ENCOUNTER — Other Ambulatory Visit: Payer: 59

## 2012-11-03 ENCOUNTER — Other Ambulatory Visit: Payer: 59

## 2012-11-03 DIAGNOSIS — Z3009 Encounter for other general counseling and advice on contraception: Secondary | ICD-10-CM

## 2012-11-03 MED ORDER — MEDROXYPROGESTERONE ACETATE 150 MG/ML IM SUSP
150.0000 mg | Freq: Once | INTRAMUSCULAR | Status: AC
  Administered 2012-11-03: 150 mg via INTRAMUSCULAR

## 2012-11-03 NOTE — Progress Notes (Unsigned)
Next Depo due 01-25-2013

## 2012-11-23 ENCOUNTER — Encounter (HOSPITAL_COMMUNITY): Payer: Self-pay | Admitting: Emergency Medicine

## 2012-11-23 ENCOUNTER — Ambulatory Visit: Payer: 59 | Admitting: Obstetrics and Gynecology

## 2012-11-23 ENCOUNTER — Encounter: Payer: Self-pay | Admitting: Obstetrics and Gynecology

## 2012-11-23 ENCOUNTER — Emergency Department (HOSPITAL_COMMUNITY)
Admission: EM | Admit: 2012-11-23 | Discharge: 2012-11-23 | Disposition: A | Discharge status: Home / Self Care | Payer: 59 | Source: Home / Self Care | Attending: Family Medicine | Admitting: Family Medicine

## 2012-11-23 ENCOUNTER — Telehealth: Payer: Self-pay | Admitting: Obstetrics and Gynecology

## 2012-11-23 VITALS — BP 120/70 | Ht 62.0 in | Wt 166.0 lb

## 2012-11-23 DIAGNOSIS — N898 Other specified noninflammatory disorders of vagina: Secondary | ICD-10-CM

## 2012-11-23 DIAGNOSIS — J111 Influenza due to unidentified influenza virus with other respiratory manifestations: Secondary | ICD-10-CM

## 2012-11-23 LAB — POCT WET PREP (WET MOUNT): pH: 5

## 2012-11-23 LAB — POCT OSOM BVBLUE TEST: Bacterial Vaginosis: POSITIVE

## 2012-11-23 LAB — POCT OSOM TRICHOMONAS RAPID TEST: Trichomonas vaginalis: NEGATIVE

## 2012-11-23 MED ORDER — LUVENA VAGINAL MOISTURIZER VA GEL: 1.0000 | VAGINAL | Status: DC

## 2012-11-23 MED ORDER — METRONIDAZOLE 0.75 % VA GEL: VAGINAL | Status: DC

## 2012-11-23 MED ORDER — HYDROCOD POLST-CHLORPHEN POLST 10-8 MG/5ML PO LQCR: 5.0000 mL | Freq: Two times a day (BID) | ORAL | Status: DC | PRN

## 2012-11-23 MED ORDER — METRONIDAZOLE 500 MG PO TABS: ORAL_TABLET | ORAL | Status: DC

## 2012-11-23 NOTE — ED Provider Notes (Signed)
History     CSN: [id number]  Arrival date & time 11/23/12  1203   First MD Initiated Contact with Patient 11/23/12 1228      Chief Complaint  Patient presents with  . Cough    (Consider location/radiation/quality/duration/timing/severity/associated sxs/prior treatment) Patient is a 27 y.o. female presenting with cough. The history is provided by the patient.  Cough This is a new problem. The current episode started yesterday. The problem has been gradually worsening. The cough is non-productive. There has been no fever. Associated symptoms include chills, sore throat and myalgias. Pertinent negatives include no chest pain and no rhinorrhea. She is a smoker.    Past Medical History  Diagnosis Date  . BIPOLAR AFFECTIVE DISORDER   . CHICKENPOX, HX OF   . DEPRESSION   . GENITAL HERPES   . HYPERTENSION   . MIGRAINE HEADACHE   . SMOKER     Past Surgical History  Procedure Date  . No past surgeries     Family History  Problem Relation Age of Onset  . Diabetes Other   . Hypertension Other   . Stroke Other     History  Substance Use Topics  . Smoking status: Current Every Day Smoker -- 1.0 packs/day    Types: Cigarettes  . Smokeless tobacco: Current User     Comment: Single- lives with domestic partner. 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 Alcohol Use: No    OB History    Grav Para Term Preterm Abortions TAB SAB Ect Mult Living                  Review of Systems  Constitutional: Positive for chills.  HENT: Positive for sore throat. Negative for congestion, rhinorrhea and postnasal drip.   Respiratory: Positive for cough.   Cardiovascular: Negative for chest pain.  Gastrointestinal: Negative.   Genitourinary: Negative.   Musculoskeletal: Positive for myalgias.    Allergies  Review of patient's allergies indicates no known allergies.  Home Medications   Current Outpatient Rx  Name  Route  Sig  Dispense  Refill  . ALEVE PO   Oral   Take by  mouth.         Marland Kitchen HYDROCOD POLST-CPM POLST ER 10-8 MG/5ML PO LQCR   Oral   Take 5 mLs by mouth every 12 (twelve) hours as needed.   115 mL   0   . MEDROXYPROGESTERONE ACETATE 150 MG/ML IM SUSP   Intramuscular   Inject 1 mL (150 mg total) into the muscle every 3 (three) months.   1 mL   3     BP 140/88  Pulse 101  Temp 99.1 F (37.3 C) (Oral)  Resp 19  SpO2 99%  LMP 11/09/2012  Physical Exam  Nursing note and vitals reviewed. Constitutional: She is oriented to person, place, and time. She appears well-developed and well-nourished.  HENT:  Head: Normocephalic.  Right Ear: External ear normal.  Left Ear: External ear normal.  Mouth/Throat: Oropharynx is clear and moist.  Eyes: Conjunctivae normal are normal. Pupils are equal, round, and reactive to light.  Neck: Normal range of motion. Neck supple.  Cardiovascular: Normal rate, regular rhythm, normal heart sounds and intact distal pulses.   Pulmonary/Chest: Effort normal and breath sounds normal.  Abdominal: Soft. Bowel sounds are normal.  Lymphadenopathy:    She has no cervical adenopathy.  Neurological: She is alert and oriented to person, place, and time.  Skin: Skin is warm and dry.    ED  Course  Procedures (including critical care time)  Labs Reviewed - No data to display No results found.   1. Influenza-like illness       MDM          Linna Hoff, MD 11/23/12 [phone]

## 2012-11-23 NOTE — Progress Notes (Signed)
GYN PROBLEM VISIT  [name] is a 27 y.o. year old female,., who presents for a problem visit.   Subjective: C/o malodorous discharge.Vaginal discharge with odor x 1 month.  Has had many episodes of BV and feels this is recurrence.  No pelvic pain  Objective:  BP 120/70  Ht 5\' 2"  (1.575 m)  Wt 166 lb (75.297 kg)  BMI 30.36 kg/m2  LMP 11/09/2012    External genitalia: normal general appearance Vaginal: normal rugae and discharge, grey Cervix: normal appearance Results for orders placed in visit on 11/23/12  POCT WET PREP (WET MOUNT)      Component Value Range   Source Wet Prep POC       WBC, Wet Prep HPF POC       Bacteria Wet Prep HPF POC       BACTERIA WET PREP MORPHOLOGY POC       Clue Cells Wet Prep HPF POC Many     CLUE CELLS WET PREP WHIFF POC Positive Whiff     Yeast Wet Prep HPF POC None     KOH Wet Prep POC       Trichomonas Wet Prep HPF POC none     pH 5.0    POCT OSOM TRICHOMONAS RAPID TEST      Component Value Range   Trichomonas vaginalis NEG    POCT OSOM BVBLUE TEST      Component Value Range   Bacterial Vaginosis POS      Assessment: Recurrent BV   Plan: Metrogel for 7 days followed by Meronidazole weekly for 12 wks and Luvenia twice weekly  Return to office prn if symptoms worsen or fail to improve or for aex   Dierdre Forth, MD  11/23/2012 7:06 PM

## 2012-11-23 NOTE — ED Notes (Signed)
Onset last night of symptoms: cough initially, now sore semi-irritated, no runny nose.  Denies fever, but is feeling soreness in side with coughing.  Denies nausea, denies vomiting

## 2012-12-04 ENCOUNTER — Other Ambulatory Visit: Payer: Self-pay

## 2012-12-20 ENCOUNTER — Encounter: Payer: 59 | Admitting: Internal Medicine

## 2012-12-23 ENCOUNTER — Telehealth: Payer: Self-pay | Admitting: Obstetrics and Gynecology

## 2012-12-23 NOTE — Telephone Encounter (Signed)
Tc to pt regarding msg.  Pt advised of how to take the 3 rx's that [facility] rx'd pt per plan in her last note.  Pt voices understanding to all.

## 2013-01-06 ENCOUNTER — Ambulatory Visit: Payer: 59 | Admitting: Internal Medicine

## 2013-04-06 ENCOUNTER — Telehealth: Payer: Self-pay | Admitting: *Deleted

## 2013-04-06 DIAGNOSIS — Z0000 Encounter for general adult medical examination without abnormal findings: Secondary | ICD-10-CM

## 2013-04-06 NOTE — Telephone Encounter (Signed)
Received staff msg pt made appt for cpx. Entering cpx labs...lmb

## 2013-04-06 NOTE — Telephone Encounter (Signed)
Message copied by Deatra James on Wed Apr 06, 2013  8:21 AM ------      Message from: Etheleen Sia      Created: Tue Apr 05, 2013  4:24 PM      Regarding: LABS       PHYSICAL LABS FOR AUG APPT ------

## 2013-04-13 ENCOUNTER — Encounter: Payer: Self-pay | Admitting: Internal Medicine

## 2013-04-13 ENCOUNTER — Other Ambulatory Visit (INDEPENDENT_AMBULATORY_CARE_PROVIDER_SITE_OTHER): Payer: 59

## 2013-04-13 ENCOUNTER — Ambulatory Visit (INDEPENDENT_AMBULATORY_CARE_PROVIDER_SITE_OTHER): Payer: 59 | Admitting: Internal Medicine

## 2013-04-13 VITALS — BP 120/85 | HR 90 | Temp 97.7°F | Wt 162.8 lb

## 2013-04-13 DIAGNOSIS — Z0000 Encounter for general adult medical examination without abnormal findings: Secondary | ICD-10-CM

## 2013-04-13 DIAGNOSIS — F17200 Nicotine dependence, unspecified, uncomplicated: Secondary | ICD-10-CM

## 2013-04-13 DIAGNOSIS — Z23 Encounter for immunization: Secondary | ICD-10-CM

## 2013-04-13 LAB — HEPATIC FUNCTION PANEL
ALT: 12 U/L (ref 0–35)
AST: 16 U/L (ref 0–37)
Alkaline Phosphatase: 65 U/L (ref 39–117)
Bilirubin, Direct: 0.1 mg/dL (ref 0.0–0.3)
Total Bilirubin: 0.7 mg/dL (ref 0.3–1.2)

## 2013-04-13 LAB — CBC WITH DIFFERENTIAL/PLATELET
Basophils Absolute: 0.1 10*3/uL (ref 0.0–0.1)
Basophils Relative: 0.7 % (ref 0.0–3.0)
Eosinophils Absolute: 0.2 10*3/uL (ref 0.0–0.7)
MCHC: 34.2 g/dL (ref 30.0–36.0)
MCV: 92.4 fl (ref 78.0–100.0)
Monocytes Absolute: 0.4 10*3/uL (ref 0.1–1.0)
Neutrophils Relative %: 58.4 % (ref 43.0–77.0)
RBC: 4.69 Mil/uL (ref 3.87–5.11)
RDW: 14.1 % (ref 11.5–14.6)

## 2013-04-13 LAB — LIPID PANEL
HDL: 42.6 mg/dL (ref >39.00)
Triglycerides: 106 mg/dL (ref 0.0–149.0)
VLDL: 21.2 mg/dL (ref 0.0–40.0)

## 2013-04-13 LAB — URINALYSIS, ROUTINE W REFLEX MICROSCOPIC
Bilirubin Urine: NEGATIVE
Ketones, ur: NEGATIVE
Leukocytes, UA: NEGATIVE
Urine Glucose: NEGATIVE
Urobilinogen, UA: 0.2 (ref 0.0–1.0)

## 2013-04-13 LAB — BASIC METABOLIC PANEL
BUN: 11 mg/dL (ref 6–23)
Calcium: 9.6 mg/dL (ref 8.4–10.5)
Creatinine, Ser: 0.7 mg/dL (ref 0.4–1.2)
GFR: 128.21 mL/min (ref >60.00)
Glucose, Bld: 86 mg/dL (ref 70–99)

## 2013-04-13 LAB — TSH: TSH: 1.53 u[IU]/mL (ref 0.35–5.50)

## 2013-04-13 MED ORDER — VARENICLINE TARTRATE 0.5 MG X 11 & 1 MG X 42 PO MISC: ORAL | Status: DC

## 2013-04-13 NOTE — Progress Notes (Signed)
Subjective:    Patient ID: Laurie Camacho, female    DOB: Jun 14, 1986, 27 y.o.   MRN: [id number]  HPI  patient is here today for annual physical. Patient feels well and has no complaints. Needs form completed for employer's wellness screening  Past Medical History  Diagnosis Date  . BIPOLAR AFFECTIVE DISORDER   . CHICKENPOX, HX OF   . DEPRESSION   . GENITAL HERPES   . HYPERTENSION   . MIGRAINE HEADACHE   . SMOKER    Family History  Problem Relation Age of Onset  . Diabetes Other   . Hypertension Other   . Stroke Other    History  Substance Use Topics  . Smoking status: Current Every Day Smoker -- 1.00 packs/day    Types: Cigarettes  . Smokeless tobacco: Current User     Comment: Single- lives with domestic partner. 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 Alcohol Use: No    Review of Systems  Constitutional: Negative for fever or unexpected weight change.  Respiratory: Negative for cough and shortness of breath.   Cardiovascular: Negative for chest pain or palpitations.  Gastrointestinal: Negative for abdominal pain, no bowel changes.  Musculoskeletal: Negative for gait problem or joint swelling.  Skin: Negative for rash.  Neurological: Negative for dizziness or headache.  No other specific complaints in a complete review of systems (except as listed in HPI above).     Objective:   Physical Exam  BP 120/85  Pulse 90  Temp(Src) 97.7 F (36.5 C) (Oral)  Wt 162 lb 12.8 oz (73.846 kg)  BMI 29.77 kg/m2  SpO2 96% Wt Readings from Last 3 Encounters:  04/13/13 162 lb 12.8 oz (73.846 kg)  11/23/12 166 lb (75.297 kg)  07/16/12 160 lb (72.576 kg)   Constitutional: She is overweight, but appears well-developed and well-nourished. No distress.  HENT: Head: Normocephalic and atraumatic. Ears: B TMs ok, no erythema or effusion; Nose: Nose normal. Mouth/Throat: Oropharynx is clear and moist. No oropharyngeal exudate.  Eyes: Conjunctivae and EOM are normal. Pupils  are equal, round, and reactive to light. No scleral icterus.  Neck: Normal range of motion. Neck supple. No JVD present. No thyromegaly present.  Cardiovascular: Normal rate, regular rhythm and normal heart sounds.  No murmur heard. No BLE edema. Pulmonary/Chest: Effort normal and breath sounds normal. No respiratory distress. She has no wheezes.  Abdominal: Soft. Bowel sounds are normal. She exhibits no distension. There is no tenderness. no masses GU: defer to gyn Musculoskeletal:  Normal range of motion, no joint effusions. No other gross deformities Neurological: She is alert and oriented to person, place, and time. No cranial nerve deficit. Coordination normal.  Skin: various tattoos - Skin is warm and dry. No rash noted. No erythema.  Psychiatric: She has a normal mood and affect. Her behavior is normal. Judgment and thought content normal.   Lab Results  Component Value Date   WBC 8.8 05/10/2012   HGB 14.1 05/10/2012   HCT 42.4 05/10/2012   PLT 173.0 05/10/2012   GLUCOSE 91 05/10/2012   CHOL 160 07/16/2012   TRIG 56.0 07/16/2012   HDL 36.00* 07/16/2012   LDLDIRECT 142.0 05/10/2012   LDLCALC 113* 07/16/2012   ALT 13 05/10/2012   AST 15 05/10/2012   NA 139 05/10/2012   K 4.1 05/10/2012   CL 108 05/10/2012   CREATININE 0.7 05/10/2012   BUN 13 05/10/2012   CO2 24 05/10/2012   TSH 0.66 05/10/2012       Assessment &  Plan:  CPX/v70.0 - Patient has been counseled on age-appropriate routine health concerns for screening and prevention. These are reviewed and up-to-date. Immunizations are up-to-date or declined. Labs ordered and will be reviewed. Form for employer to be completed as requested after lab review

## 2013-04-13 NOTE — Assessment & Plan Note (Signed)
5 minutes today spent counseling patient on unhealthy effects of continued tobacco abuse and encouragement of cessation including medical options available to help the patient quit smoking. She is interested in Chantix - education and prescription provided-

## 2013-04-13 NOTE — Patient Instructions (Signed)
It was good to see you today. Health Maintenance reviewed - tetanus diphtheria and pertussis booster updated today -schedule follow up with Dr Pennie Rushing for your PAP smear - all other recommended immunizations and age-appropriate screenings are up-to-date. Test(s) ordered today. Your results will be released to MyChart (or called to you) after review, usually within 72hours after test completion. If any changes need to be made, you will be notified at that same time. We will complete your employer health form after review of lab results and fax as requested Continue to work on lifestyle changes as discussed (low fat, low carb, increased protein diet; improved exercise efforts; weight loss) to control sugar, blood pressure and cholesterol levels and/or reduce risk of developing other medical problems.  Continue to think about giving up cigarettes! We will use Chantix prescription to help you quit - Your prescription(s) have been submitted to your pharmacy. Please take as directed and contact our office if you believe you are having problem(s) with the medication(s). Please schedule followup in 1-2 years for medical physical and labs, call sooner if problems.  Health Maintenance, Females A healthy lifestyle and preventative care can promote health and wellness.  Maintain regular health, dental, and eye exams.  Eat a healthy diet. Foods like vegetables, fruits, whole grains, low-fat dairy products, and lean protein foods contain the nutrients you need without too many calories. Decrease your intake of foods high in solid fats, added sugars, and salt. Get information about a proper diet from your caregiver, if necessary.  Regular physical exercise is one of the most important things you can do for your health. Most adults should get at least 150 minutes of moderate-intensity exercise (any activity that increases your heart rate and causes you to sweat) each week. In addition, most adults need  muscle-strengthening exercises on 2 or more days a week.   Maintain a healthy weight. The body mass index (BMI) is a screening tool to identify possible weight problems. It provides an estimate of body fat based on height and weight. Your caregiver can help determine your BMI, and can help you achieve or maintain a healthy weight. For adults 20 years and older:  A BMI below 18.5 is considered underweight.  A BMI of 18.5 to 24.9 is normal.  A BMI of 25 to 29.9 is considered overweight.  A BMI of 30 and above is considered obese.  Maintain normal blood lipids and cholesterol by exercising and minimizing your intake of saturated fat. Eat a balanced diet with plenty of fruits and vegetables. Blood tests for lipids and cholesterol should begin at age 45 and be repeated every 5 years. If your lipid or cholesterol levels are high, you are over 50, or you are a high risk for heart disease, you may need your cholesterol levels checked more frequently.Ongoing high lipid and cholesterol levels should be treated with medicines if diet and exercise are not effective.  If you smoke, find out from your caregiver how to quit. If you do not use tobacco, do not start.  If you are pregnant, do not drink alcohol. If you are breastfeeding, be very cautious about drinking alcohol. If you are not pregnant and choose to drink alcohol, do not exceed 1 drink per day. One drink is considered to be 12 ounces (355 mL) of beer, 5 ounces (148 mL) of wine, or 1.5 ounces (44 mL) of liquor.  Avoid use of street drugs. Do not share needles with anyone. Ask for help if you need support  or instructions about stopping the use of drugs.  High blood pressure causes heart disease and increases the risk of stroke. Blood pressure should be checked at least every 1 to 2 years. Ongoing high blood pressure should be treated with medicines, if weight loss and exercise are not effective.  If you are 36 to 28 years old, ask your caregiver  if you should take aspirin to prevent strokes.  Diabetes screening involves taking a blood sample to check your fasting blood sugar level. This should be done once every 3 years, after age 39, if you are within normal weight and without risk factors for diabetes. Testing should be considered at a younger age or be carried out more frequently if you are overweight and have at least 1 risk factor for diabetes.  Breast cancer screening is essential preventative care for women. You should practice "breast self-awareness." This means understanding the normal appearance and feel of your breasts and may include breast self-examination. Any changes detected, no matter how small, should be reported to a caregiver. Women in their 19s and 30s should have a clinical breast exam (CBE) by a caregiver as part of a regular health exam every 1 to 3 years. After age 49, women should have a CBE every year. Starting at age 22, women should consider having a mammogram (breast X-ray) every year. Women who have a family history of breast cancer should talk to their caregiver about genetic screening. Women at a high risk of breast cancer should talk to their caregiver about having an MRI and a mammogram every year.  The Pap test is a screening test for cervical cancer. Women should have a Pap test starting at age 51. Between ages 61 and 15, Pap tests should be repeated every 2 years. Beginning at age 12, you should have a Pap test every 3 years as long as the past 3 Pap tests have been normal. If you had a hysterectomy for a problem that was not cancer or a condition that could lead to cancer, then you no longer need Pap tests. If you are between ages 30 and 2, and you have had normal Pap tests going back 10 years, you no longer need Pap tests. If you have had past treatment for cervical cancer or a condition that could lead to cancer, you need Pap tests and screening for cancer for at least 20 years after your treatment. If Pap  tests have been discontinued, risk factors (such as a new sexual partner) need to be reassessed to determine if screening should be resumed. Some women have medical problems that increase the chance of getting cervical cancer. In these cases, your caregiver may recommend more frequent screening and Pap tests.  The human papillomavirus (HPV) test is an additional test that may be used for cervical cancer screening. The HPV test looks for the virus that can cause the cell changes on the cervix. The cells collected during the Pap test can be tested for HPV. The HPV test could be used to screen women aged 93 years and older, and should be used in women of any age who have unclear Pap test results. After the age of 47, women should have HPV testing at the same frequency as a Pap test.  Colorectal cancer can be detected and often prevented. Most routine colorectal cancer screening begins at the age of 34 and continues through age 51. However, your caregiver may recommend screening at an earlier age if you have risk factors for  colon cancer. On a yearly basis, your caregiver may provide home test kits to check for hidden blood in the stool. Use of a small camera at the end of a tube, to directly examine the colon (sigmoidoscopy or colonoscopy), can detect the earliest forms of colorectal cancer. Talk to your caregiver about this at age 16, when routine screening begins. Direct examination of the colon should be repeated every 5 to 10 years through age 75, unless early forms of pre-cancerous polyps or small growths are found.  Hepatitis C blood testing is recommended for all people born from [birth year] through 1966 and any individual with known risks for hepatitis C.  Practice safe sex. Use condoms and avoid high-risk sexual practices to reduce the spread of sexually transmitted infections (STIs). Sexually active women aged 17 and younger should be checked for Chlamydia, which is a common sexually transmitted infection.  Older women with new or multiple partners should also be tested for Chlamydia. Testing for other STIs is recommended if you are sexually active and at increased risk.  Osteoporosis is a disease in which the bones lose minerals and strength with aging. This can result in serious bone fractures. The risk of osteoporosis can be identified using a bone density scan. Women ages 91 and over and women at risk for fractures or osteoporosis should discuss screening with their caregivers. Ask your caregiver whether you should be taking a calcium supplement or vitamin D to reduce the rate of osteoporosis.  Menopause can be associated with physical symptoms and risks. Hormone replacement therapy is available to decrease symptoms and risks. You should talk to your caregiver about whether hormone replacement therapy is right for you.  Use sunscreen with a sun protection factor (SPF) of 30 or greater. Apply sunscreen liberally and repeatedly throughout the day. You should seek shade when your shadow is shorter than you. Protect yourself by wearing long sleeves, pants, a wide-brimmed hat, and sunglasses year round, whenever you are outdoors.  Notify your caregiver of new moles or changes in moles, especially if there is a change in shape or color. Also notify your caregiver if a mole is larger than the size of a pencil eraser.  Stay current with your immunizations. Document Released: 04/21/2011 Document Revised: 12/29/2011 Document Reviewed: 04/21/2011 [facility] Patient Information 2014 [place], [place]. Smoking Cessation Quitting smoking is important to your health and has many advantages. However, it is not always easy to quit since nicotine is a very addictive drug. Often times, people try 3 times or more before being able to quit. This document explains the best ways for you to prepare to quit smoking. Quitting takes hard work and a lot of effort, but you can do it. ADVANTAGES OF QUITTING SMOKING  You will live  longer, feel better, and live better.  Your body will feel the impact of quitting smoking almost immediately.  Within 20 minutes, blood pressure decreases. Your pulse returns to its normal level.  After 8 hours, carbon monoxide levels in the blood return to normal. Your oxygen level increases.  After 24 hours, the chance of having a heart attack starts to decrease. Your breath, hair, and body stop smelling like smoke.  After 48 hours, damaged nerve endings begin to recover. Your sense of taste and smell improve.  After 72 hours, the body is virtually free of nicotine. Your bronchial tubes relax and breathing becomes easier.  After 2 to 12 weeks, lungs can hold more air. Exercise becomes easier and circulation improves.  The  risk of having a heart attack, stroke, cancer, or lung disease is greatly reduced.  After 1 year, the risk of coronary heart disease is cut in half.  After 5 years, the risk of stroke falls to the same as a nonsmoker.  After 10 years, the risk of lung cancer is cut in half and the risk of other cancers decreases significantly.  After 15 years, the risk of coronary heart disease drops, usually to the level of a nonsmoker.  If you are pregnant, quitting smoking will improve your chances of having a healthy baby.  The people you live with, especially any children, will be healthier.  You will have extra money to spend on things other than cigarettes. QUESTIONS TO THINK ABOUT BEFORE ATTEMPTING TO QUIT You may want to talk about your answers with your caregiver.  Why do you want to quit?  If you tried to quit in the past, what helped and what did not?  What will be the most difficult situations for you after you quit? How will you plan to handle them?  Who can help you through the tough times? Your family? Friends? A caregiver?  What pleasures do you get from smoking? What ways can you still get pleasure if you quit? Here are some questions to ask your  caregiver:  How can you help me to be successful at quitting?  What medicine do you think would be best for me and how should I take it?  What should I do if I need more help?  What is smoking withdrawal like? How can I get information on withdrawal? GET READY  Set a quit date.  Change your environment by getting rid of all cigarettes, ashtrays, matches, and lighters in your home, car, or work. Do not let people smoke in your home.  Review your past attempts to quit. Think about what worked and what did not. GET SUPPORT AND ENCOURAGEMENT You have a better chance of being successful if you have help. You can get support in many ways.  Tell your family, friends, and co-workers that you are going to quit and need their support. Ask them not to smoke around you.  Get individual, group, or telephone counseling and support. Programs are available at [employer] and health centers. Call your local health department for information about programs in your area.  Spiritual beliefs and practices may help some smokers quit.  Download a "quit meter" on your computer to keep track of quit statistics, such as how long you have gone without smoking, cigarettes not smoked, and money saved.  Get a self-help book about quitting smoking and staying off of tobacco. LEARN NEW SKILLS AND BEHAVIORS  Distract yourself from urges to smoke. Talk to someone, go for a walk, or occupy your time with a task.  Change your normal routine. Take a different route to work. Drink tea instead of coffee. Eat breakfast in a different place.  Reduce your stress. Take a hot bath, exercise, or read a book.  Plan something enjoyable to do every day. Reward yourself for not smoking.  Explore interactive web-based programs that specialize in helping you quit. GET MEDICINE AND USE IT CORRECTLY Medicines can help you stop smoking and decrease the urge to smoke. Combining medicine with the above behavioral methods and  support can greatly increase your chances of successfully quitting smoking.  Nicotine replacement therapy helps deliver nicotine to your body without the negative effects and risks of smoking. Nicotine replacement therapy includes nicotine  gum, lozenges, inhalers, nasal sprays, and skin patches. Some may be available over-the-counter and others require a prescription.  Antidepressant medicine helps people abstain from smoking, but how this works is unknown. This medicine is available by prescription.  Nicotinic receptor partial agonist medicine simulates the effect of nicotine in your brain. This medicine is available by prescription. Ask your caregiver for advice about which medicines to use and how to use them based on your health history. Your caregiver will tell you what side effects to look out for if you choose to be on a medicine or therapy. Carefully read the information on the package. Do not use any other product containing nicotine while using a nicotine replacement product.  RELAPSE OR DIFFICULT SITUATIONS Most relapses occur within the first 3 months after quitting. Do not be discouraged if you start smoking again. Remember, most people try several times before finally quitting. You may have symptoms of withdrawal because your body is used to nicotine. You may crave cigarettes, be irritable, feel very hungry, cough often, get headaches, or have difficulty concentrating. The withdrawal symptoms are only temporary. They are strongest when you first quit, but they will go away within 10 14 days. To reduce the chances of relapse, try to:  Avoid drinking alcohol. Drinking lowers your chances of successfully quitting.  Reduce the amount of caffeine you consume. Once you quit smoking, the amount of caffeine in your body increases and can give you symptoms, such as a rapid heartbeat, sweating, and anxiety.  Avoid smokers because they can make you want to smoke.  Do not let weight gain distract  you. Many smokers will gain weight when they quit, usually less than 10 pounds. Eat a healthy diet and stay active. You can always lose the weight gained after you quit.  Find ways to improve your mood other than smoking. FOR MORE INFORMATION  www.smokefree.gov  Document Released: 09/30/2001 Document Revised: 04/06/2012 Document Reviewed: 01/15/2012 [facility] Patient Information 2014 [place], [place].

## 2013-06-06 ENCOUNTER — Encounter: Payer: 59 | Admitting: Internal Medicine

## 2013-08-13 ENCOUNTER — Emergency Department (INDEPENDENT_AMBULATORY_CARE_PROVIDER_SITE_OTHER)
Admission: EM | Admit: 2013-08-13 | Discharge: 2013-08-13 | Disposition: A | Discharge status: Home / Self Care | Payer: 59 | Source: Home / Self Care | Attending: Emergency Medicine | Admitting: Emergency Medicine

## 2013-08-13 ENCOUNTER — Encounter (HOSPITAL_COMMUNITY): Payer: Self-pay | Admitting: Emergency Medicine

## 2013-08-13 DIAGNOSIS — B009 Herpesviral infection, unspecified: Secondary | ICD-10-CM

## 2013-08-13 DIAGNOSIS — B001 Herpesviral vesicular dermatitis: Secondary | ICD-10-CM

## 2013-08-13 DIAGNOSIS — L0100 Impetigo, unspecified: Secondary | ICD-10-CM

## 2013-08-13 MED ORDER — RETAPAMULIN 1 % EX OINT: TOPICAL_OINTMENT | Freq: Two times a day (BID) | CUTANEOUS | Status: DC

## 2013-08-13 MED ORDER — VALACYCLOVIR HCL 1 G PO TABS: 1000.0000 mg | ORAL_TABLET | Freq: Three times a day (TID) | ORAL | Status: AC

## 2013-08-13 NOTE — ED Provider Notes (Signed)
CSN: [id number]     Arrival date & time 08/13/13  1055 History   First MD Initiated Contact with Patient 08/13/13 1223     Chief Complaint  Patient presents with  . Rash   (Consider location/radiation/quality/duration/timing/severity/associated sxs/prior Treatment) HPI Comments: 27 year old female presents complaining of rash on the left side of her mouth below her lower lip for about one week. She has a history of cold sores. She recently has been getting over a cold sore, she is taking Valtrex 1000 mg twice a day. The cold sores clearing up, but now she is developing this rash. The rash is tingling and itching, and she has a tingling sensation in her nose as well. She denies any pain associated with this, and she has never had this associated with a cold sore before. She worries that it may be shingles because she has had chickenpox before. She denies any systemic symptoms including fever, chills, NVD, headache, blurry vision.   Past Medical History  Diagnosis Date  . BIPOLAR AFFECTIVE DISORDER   . CHICKENPOX, HX OF   . DEPRESSION   . GENITAL HERPES   . HYPERTENSION   . MIGRAINE HEADACHE   . SMOKER    Past Surgical History  Procedure Laterality Date  . No past surgeries     Family History  Problem Relation Age of Onset  . Diabetes Other   . Hypertension Other   . Stroke Other    History  Substance Use Topics  . Smoking status: Current Every Day Smoker -- 1.00 packs/day    Types: Cigarettes  . Smokeless tobacco: Current User     Comment: Single- lives with domestic partner. 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 Alcohol Use: No   OB History   Grav Para Term Preterm Abortions TAB SAB Ect Mult Living                 Review of Systems  Constitutional: Negative for fever and chills.  Eyes: Negative for visual disturbance.  Respiratory: Negative for cough and shortness of breath.   Cardiovascular: Negative for chest pain, palpitations and leg swelling.    Gastrointestinal: Negative for nausea, vomiting and abdominal pain.  Endocrine: Negative for polydipsia and polyuria.  Genitourinary: Negative for dysuria, urgency and frequency.  Musculoskeletal: Negative for arthralgias and myalgias.  Skin: Positive for rash (see history of present illness).  Neurological: Negative for dizziness, weakness and light-headedness.    Allergies  Review of patient's allergies indicates no known allergies.  Home Medications   Current Outpatient Rx  Name  Route  Sig  Dispense  Refill  . retapamulin (ALTABAX) 1 % ointment   Topical   Apply topically 2 (two) times daily.   15 g   0   . valACYclovir (VALTREX) 1000 MG tablet   Oral   Take 1,000 mg by mouth 2 (two) times daily.         . valACYclovir (VALTREX) 1000 MG tablet   Oral   Take 1 tablet (1,000 mg total) by mouth 3 (three) times daily.   21 tablet   0   . varenicline (CHANTIX STARTING MONTH PAK) 0.5 MG X 11 & 1 MG X 42 tablet      Take one 0.5 mg tablet by mouth once daily for 3 days, then increase to one 0.5 mg tablet twice daily for 4 days, then increase to one 1 mg tablet twice daily.   53 tablet   0    BP 134/83  Pulse 84  Temp(Src) 97.8 F (36.6 C) (Oral)  Resp 18  SpO2 100%  LMP 08/13/2013 Physical Exam  Nursing note and vitals reviewed. Constitutional: She is oriented to person, place, and time. Vital signs are normal. She appears well-developed and well-nourished. No distress.  HENT:  Head: Normocephalic and atraumatic.    Pulmonary/Chest: Effort normal. No respiratory distress.  Neurological: She is alert and oriented to person, place, and time. She has normal strength. Coordination normal.  Skin: Skin is warm and dry. Rash noted. She is not diaphoretic.  Psychiatric: She has a normal mood and affect. Judgment normal.    ED Course  Procedures (including critical care time) Labs Review Labs Reviewed - No data to display Imaging Review No results  found.    MDM   1. Cold sore   2. Impetigo    This is either secondary impetigo infection or worsening outbreak of herpes labialis. This is not shingles because it does cross the midline. We will continue her Valtrex and also prescribed Altabax ointment twice daily. Followup as needed if not improving    Meds ordered this encounter  Medications  . retapamulin (ALTABAX) 1 % ointment    Sig: Apply topically 2 (two) times daily.    Dispense:  15 g    Refill:  0    Order Specific Question:  Supervising Provider    Answer:  Lorenz Coaster, DAVID C [id number]  . valACYclovir (VALTREX) 1000 MG tablet    Sig: Take 1 tablet (1,000 mg total) by mouth 3 (three) times daily.    Dispense:  21 tablet    Refill:  0    Order Specific Question:  Supervising Provider    Answer:  Lorenz Coaster, DAVID C [[id number]]        Graylon Good, PA-C 08/13/13 1339

## 2013-08-13 NOTE — ED Notes (Signed)
Pt c/o rash on left side of mouth onset 1 week Reports its spreading Sxs include: pain, itchiness, burning Denies: fevers PCP called in Valacyclovir... Has been on it for 4 days  Alert w/no signs of acute distress.

## 2013-08-13 NOTE — ED Provider Notes (Signed)
Medical screening examination/treatment/procedure(s) were performed by non-physician practitioner and as supervising physician I was immediately available for consultation/collaboration.  Leslee Home, M.D.  Reuben Likes, MD 08/13/13 2028

## 2013-08-25 ENCOUNTER — Other Ambulatory Visit: Payer: Self-pay

## 2014-06-14 ENCOUNTER — Encounter: Payer: 59 | Admitting: Internal Medicine

## 2014-11-23 ENCOUNTER — Encounter: Payer: Self-pay | Admitting: Internal Medicine

## 2014-11-29 ENCOUNTER — Encounter: Payer: Self-pay | Admitting: Internal Medicine

## 2014-12-04 ENCOUNTER — Encounter: Payer: Self-pay | Admitting: Internal Medicine

## 2014-12-11 ENCOUNTER — Other Ambulatory Visit (INDEPENDENT_AMBULATORY_CARE_PROVIDER_SITE_OTHER): Payer: 59

## 2014-12-11 ENCOUNTER — Ambulatory Visit (INDEPENDENT_AMBULATORY_CARE_PROVIDER_SITE_OTHER): Payer: 59 | Admitting: Internal Medicine

## 2014-12-11 ENCOUNTER — Encounter: Payer: Self-pay | Admitting: Internal Medicine

## 2014-12-11 VITALS — BP 126/64 | HR 101 | Temp 98.5°F | Resp 18 | Ht 62.0 in | Wt 188.0 lb

## 2014-12-11 DIAGNOSIS — Z0000 Encounter for general adult medical examination without abnormal findings: Secondary | ICD-10-CM | POA: Insufficient documentation

## 2014-12-11 DIAGNOSIS — F17200 Nicotine dependence, unspecified, uncomplicated: Secondary | ICD-10-CM

## 2014-12-11 DIAGNOSIS — Z720 Tobacco use: Secondary | ICD-10-CM

## 2014-12-11 LAB — BASIC METABOLIC PANEL
BUN: 14 mg/dL (ref 6–23)
CO2: 27 mEq/L (ref 19–32)
Calcium: 9.4 mg/dL (ref 8.4–10.5)
Chloride: 109 mEq/L (ref 96–112)
Creatinine, Ser: 0.64 mg/dL (ref 0.40–1.20)
GFR: 140.52 mL/min (ref >60.00)
GLUCOSE: 87 mg/dL (ref 70–99)
Potassium: 4.3 mEq/L (ref 3.5–5.1)
Sodium: 139 mEq/L (ref 135–145)

## 2014-12-11 LAB — LIPID PANEL
Cholesterol: 171 mg/dL (ref 0–200)
HDL: 43 mg/dL (ref >39.00)
LDL Cholesterol: 91 mg/dL (ref 0–99)
NonHDL: 128
Total CHOL/HDL Ratio: 4
Triglycerides: 186 mg/dL — ABNORMAL HIGH (ref 0.0–149.0)
VLDL: 37.2 mg/dL (ref 0.0–40.0)

## 2014-12-11 LAB — HEMOGLOBIN A1C: Hgb A1c MFr Bld: 5.1 % (ref 4.6–6.5)

## 2014-12-11 NOTE — Assessment & Plan Note (Signed)
Reminded about the risks and harms of cigarette smoke, she is currently in a program at work to help her quit.

## 2014-12-11 NOTE — Assessment & Plan Note (Signed)
Check routine labs, up to date on tetanus and pap smear. Declines flu shot. Exercising. Reminded about smoking cessation and she is in a program at work to help her quit.

## 2014-12-11 NOTE — Progress Notes (Signed)
Pre visit review using our clinic review tool, if applicable. No additional management support is needed unless otherwise documented below in the visit note. 

## 2014-12-11 NOTE — Progress Notes (Signed)
   Subjective:    Patient ID: Laurie Camacho, female    DOB: 31-May-1986, 29 y.o.   MRN: [id number]  HPI The patient is a 29 YO female coming in for wellness. She denies any new medical problems. She is a smoker and uses cigarettes for stress. She has though about quitting and was prescribed chantix 1-2 years ago but she was too scared to take it. Has been exercising lately 2-3 times per week.   [facility], social history reviewed and updated.   Review of Systems  Constitutional: Negative.   HENT: Negative.   Eyes: Negative.   Respiratory: Negative for cough, chest tightness, shortness of breath and wheezing.   Cardiovascular: Negative for chest pain, palpitations and leg swelling.  Gastrointestinal: Negative for nausea, abdominal pain, diarrhea, constipation and abdominal distention.  Endocrine: Negative.   Genitourinary: Negative.   Musculoskeletal: Negative.   Skin: Negative.   Neurological: Negative.   Psychiatric/Behavioral: Negative.       Objective:   Physical Exam  Constitutional: She is oriented to person, place, and time. She appears well-developed and well-nourished.  HENT:  Head: Normocephalic and atraumatic.  Eyes: EOM are normal.  Neck: Normal range of motion.  Cardiovascular: Normal rate and regular rhythm.   No murmur heard. Pulmonary/Chest: Effort normal and breath sounds normal. No respiratory distress. She has no wheezes. She has no rales.  Abdominal: Soft. Bowel sounds are normal. She exhibits no distension. There is no tenderness. There is no rebound.  Musculoskeletal: She exhibits no edema.  Neurological: She is alert and oriented to person, place, and time. Coordination normal.  Skin: Skin is warm and dry.  Psychiatric: She has a normal mood and affect.   Filed Vitals:   12/11/14 0856  BP: 126/64  Pulse: 101  Temp: 98.5 F (36.9 C)  TempSrc: Oral  Resp: 18  Height: 5\' 2"  (1.575 m)  Weight: 188 lb (85.276 kg)  SpO2: 95%      Assessment & Plan:

## 2014-12-11 NOTE — Patient Instructions (Signed)
We are going to check your blood work today to make sure we are not missing anything.   Your blood pressure is great today. The only things to work on you are already thinking about including: stopping smoking and working on exercising regularly.   Come back in 1-2 years for a physical. If you have any problems or questions before then please feel free to call our office.   Health Maintenance Adopting a healthy lifestyle and getting preventive care can go a long way to promote health and wellness. Talk with your health care provider about what schedule of regular examinations is right for you. This is a good chance for you to check in with your provider about disease prevention and staying healthy. In between checkups, there are plenty of things you can do on your own. Experts have done a lot of research about which lifestyle changes and preventive measures are most likely to keep you healthy. Ask your health care provider for more information. WEIGHT AND DIET  Eat a healthy diet  Be sure to include plenty of vegetables, fruits, low-fat dairy products, and lean protein.  Do not eat a lot of foods high in solid fats, added sugars, or salt.  Get regular exercise. This is one of the most important things you can do for your health.  Most adults should exercise for at least 150 minutes each week. The exercise should increase your heart rate and make you sweat (moderate-intensity exercise).  Most adults should also do strengthening exercises at least twice a week. This is in addition to the moderate-intensity exercise.  Maintain a healthy weight  Body mass index (BMI) is a measurement that can be used to identify possible weight problems. It estimates body fat based on height and weight. Your health care provider can help determine your BMI and help you achieve or maintain a healthy weight.  For females 27 years of age and older:   A BMI below 18.5 is considered underweight.  A BMI of 18.5  to 24.9 is normal.  A BMI of 25 to 29.9 is considered overweight.  A BMI of 30 and above is considered obese.  Watch levels of cholesterol and blood lipids  You should start having your blood tested for lipids and cholesterol at 30 years of age, then have this test every 5 years.  You may need to have your cholesterol levels checked more often if:  Your lipid or cholesterol levels are high.  You are older than 30 years of age.  You are at high risk for heart disease.  CANCER SCREENING   Lung Cancer  Lung cancer screening is recommended for adults 60-22 years old who are at high risk for lung cancer because of a history of smoking.  A yearly low-dose CT scan of the lungs is recommended for people who:  Currently smoke.  Have quit within the past 15 years.  Have at least a 30-pack-year history of smoking. A pack year is smoking an average of one pack of cigarettes a day for 1 year.  Yearly screening should continue until it has been 15 years since you quit.  Yearly screening should stop if you develop a health problem that would prevent you from having lung cancer treatment.  Breast Cancer  Practice breast self-awareness. This means understanding how your breasts normally appear and feel.  It also means doing regular breast self-exams. Let your health care provider know about any changes, no matter how small.  If you are  in your 65s or 30s, you should have a clinical breast exam (CBE) by a health care provider every 1-3 years as part of a regular health exam.  If you are 53 or older, have a CBE every year. Also consider having a breast X-ray (mammogram) every year.  If you have a family history of breast cancer, talk to your health care provider about genetic screening.  If you are at high risk for breast cancer, talk to your health care provider about having an MRI and a mammogram every year.  Breast cancer gene (BRCA) assessment is recommended for women who have  family members with BRCA-related cancers. BRCA-related cancers include:  Breast.  Ovarian.  Tubal.  Peritoneal cancers.  Results of the assessment will determine the need for genetic counseling and BRCA1 and BRCA2 testing. Cervical Cancer Routine pelvic examinations to screen for cervical cancer are no longer recommended for nonpregnant women who are considered low risk for cancer of the pelvic organs (ovaries, uterus, and vagina) and who do not have symptoms. A pelvic examination may be necessary if you have symptoms including those associated with pelvic infections. Ask your health care provider if a screening pelvic exam is right for you.   The Pap test is the screening test for cervical cancer for women who are considered at risk.  If you had a hysterectomy for a problem that was not cancer or a condition that could lead to cancer, then you no longer need Pap tests.  If you are older than 65 years, and you have had normal Pap tests for the past 10 years, you no longer need to have Pap tests.  If you have had past treatment for cervical cancer or a condition that could lead to cancer, you need Pap tests and screening for cancer for at least 20 years after your treatment.  If you no longer get a Pap test, assess your risk factors if they change (such as having a new sexual partner). This can affect whether you should start being screened again.  Some women have medical problems that increase their chance of getting cervical cancer. If this is the case for you, your health care provider may recommend more frequent screening and Pap tests.  The human papillomavirus (HPV) test is another test that may be used for cervical cancer screening. The HPV test looks for the virus that can cause cell changes in the cervix. The cells collected during the Pap test can be tested for HPV.  The HPV test can be used to screen women 85 years of age and older. Getting tested for HPV can extend the interval  between normal Pap tests from three to five years.  An HPV test also should be used to screen women of any age who have unclear Pap test results.  After 30 years of age, women should have HPV testing as often as Pap tests.  Colorectal Cancer  This type of cancer can be detected and often prevented.  Routine colorectal cancer screening usually begins at 30 years of age and continues through 30 years of age.  Your health care provider may recommend screening at an earlier age if you have risk factors for colon cancer.  Your health care provider may also recommend using home test kits to check for hidden blood in the stool.  A small camera at the end of a tube can be used to examine your colon directly (sigmoidoscopy or colonoscopy). This is done to check for the earliest forms  of colorectal cancer.  Routine screening usually begins at age 33.  Direct examination of the colon should be repeated every 5-10 years through 30 years of age. However, you may need to be screened more often if early forms of precancerous polyps or small growths are found. Skin Cancer  Check your skin from head to toe regularly.  Tell your health care provider about any new moles or changes in moles, especially if there is a change in a mole's shape or color.  Also tell your health care provider if you have a mole that is larger than the size of a pencil eraser.  Always use sunscreen. Apply sunscreen liberally and repeatedly throughout the day.  Protect yourself by wearing long sleeves, pants, a wide-brimmed hat, and sunglasses whenever you are outside. HEART DISEASE, DIABETES, AND HIGH BLOOD PRESSURE   Have your blood pressure checked at least every 1-2 years. High blood pressure causes heart disease and increases the risk of stroke.  If you are between 89 years and 59 years old, ask your health care provider if you should take aspirin to prevent strokes.  Have regular diabetes screenings. This involves  taking a blood sample to check your fasting blood sugar level.  If you are at a normal weight and have a low risk for diabetes, have this test once every three years after 30 years of age.  If you are overweight and have a high risk for diabetes, consider being tested at a younger age or more often. PREVENTING INFECTION  Hepatitis B  If you have a higher risk for hepatitis B, you should be screened for this virus. You are considered at high risk for hepatitis B if:  You were born in a country where hepatitis B is common. Ask your health care provider which countries are considered high risk.  Your parents were born in a high-risk country, and you have not been immunized against hepatitis B (hepatitis B vaccine).  You have HIV or AIDS.  You use needles to inject street drugs.  You live with someone who has hepatitis B.  You have had sex with someone who has hepatitis B.  You get hemodialysis treatment.  You take certain medicines for conditions, including cancer, organ transplantation, and autoimmune conditions. Hepatitis C  Blood testing is recommended for:  Everyone born from [birth year] through 1966.  Anyone with known risk factors for hepatitis C. Sexually transmitted infections (STIs)  You should be screened for sexually transmitted infections (STIs) including gonorrhea and chlamydia if:  You are sexually active and are younger than 30 years of age.  You are older than 30 years of age and your health care provider tells you that you are at risk for this type of infection.  Your sexual activity has changed since you were last screened and you are at an increased risk for chlamydia or gonorrhea. Ask your health care provider if you are at risk.  If you do not have HIV, but are at risk, it may be recommended that you take a prescription medicine daily to prevent HIV infection. This is called pre-exposure prophylaxis (PrEP). You are considered at risk if:  You are sexually active  and do not regularly use condoms or know the HIV status of your partner(s).  You take drugs by injection.  You are sexually active with a partner who has HIV. Talk with your health care provider about whether you are at high risk of being infected with HIV. If you choose to begin  PrEP, you should first be tested for HIV. You should then be tested every 3 months for as long as you are taking PrEP.  PREGNANCY   If you are premenopausal and you may become pregnant, ask your health care provider about preconception counseling.  If you may become pregnant, take 400 to 800 micrograms (mcg) of folic acid every day.  If you want to prevent pregnancy, talk to your health care provider about birth control (contraception). OSTEOPOROSIS AND MENOPAUSE   Osteoporosis is a disease in which the bones lose minerals and strength with aging. This can result in serious bone fractures. Your risk for osteoporosis can be identified using a bone density scan.  If you are 15 years of age or older, or if you are at risk for osteoporosis and fractures, ask your health care provider if you should be screened.  Ask your health care provider whether you should take a calcium or vitamin D supplement to lower your risk for osteoporosis.  Menopause may have certain physical symptoms and risks.  Hormone replacement therapy may reduce some of these symptoms and risks. Talk to your health care provider about whether hormone replacement therapy is right for you.  HOME CARE INSTRUCTIONS   Schedule regular health, dental, and eye exams.  Stay current with your immunizations.   Do not use any tobacco products including cigarettes, chewing tobacco, or electronic cigarettes.  If you are pregnant, do not drink alcohol.  If you are breastfeeding, limit how much and how often you drink alcohol.  Limit alcohol intake to no more than 1 drink per day for nonpregnant women. One drink equals 12 ounces of beer, 5 ounces of wine,  or 1 ounces of hard liquor.  Do not use street drugs.  Do not share needles.  Ask your health care provider for help if you need support or information about quitting drugs.  Tell your health care provider if you often feel depressed.  Tell your health care provider if you have ever been abused or do not feel safe at home. Document Released: 04/21/2011 Document Revised: 02/20/2014 Document Reviewed: 09/07/2013 [facility] Patient Information 2015 [place], [place]. This information is not intended to replace advice given to you by your health care provider. Make sure you discuss any questions you have with your health care provider.

## 2016-01-31 ENCOUNTER — Other Ambulatory Visit (HOSPITAL_COMMUNITY)
Admission: RE | Admit: 2016-01-31 | Discharge: 2016-01-31 | Disposition: A | Discharge status: Home / Self Care | Payer: 59 | Source: Ambulatory Visit | Attending: Nurse Practitioner | Admitting: Nurse Practitioner

## 2016-01-31 ENCOUNTER — Other Ambulatory Visit: Payer: Self-pay | Admitting: Nurse Practitioner

## 2016-01-31 DIAGNOSIS — Z01411 Encounter for gynecological examination (general) (routine) with abnormal findings: Secondary | ICD-10-CM | POA: Insufficient documentation

## 2016-01-31 DIAGNOSIS — Z1151 Encounter for screening for human papillomavirus (HPV): Secondary | ICD-10-CM | POA: Insufficient documentation

## 2016-02-04 LAB — CYTOLOGY - PAP

## 2017-10-14 ENCOUNTER — Ambulatory Visit (HOSPITAL_COMMUNITY)
Admission: EM | Admit: 2017-10-14 | Discharge: 2017-10-14 | Disposition: A | Discharge status: Home / Self Care | Payer: 59 | Attending: Family Medicine | Admitting: Family Medicine

## 2017-10-14 ENCOUNTER — Encounter (HOSPITAL_COMMUNITY): Payer: Self-pay | Admitting: Family Medicine

## 2017-10-14 ENCOUNTER — Other Ambulatory Visit: Payer: Self-pay

## 2017-10-14 DIAGNOSIS — S161XXA Strain of muscle, fascia and tendon at neck level, initial encounter: Secondary | ICD-10-CM | POA: Diagnosis not present

## 2017-10-14 NOTE — Discharge Instructions (Signed)
Gentle ROM of neck three times a day, consider massage and yoga or swimming

## 2017-10-14 NOTE — ED Provider Notes (Signed)
  [facility]   [id number] 10/14/17 Arrival Time: 1807   SUBJECTIVE:  Laurie Camacho is a 31 y.o. female who presents to the urgent care with complaint of head, neck pain with right hand paresthesias.  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  Left handed, tends to stay in static position for hours at a time  Past Medical History:  Diagnosis Date  . BIPOLAR AFFECTIVE DISORDER   . CHICKENPOX, HX OF   . DEPRESSION   . GENITAL HERPES   . HYPERTENSION   . MIGRAINE HEADACHE   . SMOKER    Family History  Problem Relation Age of Onset  . Diabetes Other   . Hypertension Other   . Stroke Other    Social History   Socioeconomic History  . Marital status: Single    Spouse name: Not on file  . Number of children: Not on file  . Years of education: Not on file  . Highest education level: Not on file  Social Needs  . Financial resource strain: Not on file  . Food insecurity - worry: Not on file  . Food insecurity - inability: Not on file  . Transportation needs - medical: Not on file  . Transportation needs - non-medical: Not on file  Occupational History  . Not on file  Tobacco Use  . Smoking status: Current Every Day Smoker    Packs/day: 1.00    Types: Cigarettes  . Smokeless tobacco: Current User  . Tobacco comment: Single- lives with domestic partner. Employed @ [employer] professional  Substance and Sexual Activity  . Alcohol use: No  . Drug use: No  . Sexual activity: Yes    Birth control/protection: Injection  Other Topics Concern  . Not on file  Social History Narrative  . Not on file   No outpatient medications have been marked as taking for the 10/14/17 encounter [facility](Hospital Encounter).   No Known Allergies    ROS: As per HPI, remainder of ROS negative.   OBJECTIVE:   Vitals:   10/14/17 1833  BP: 138/87  Pulse: 80  Resp: 16  Temp: 98.5 F (36.9 C)  TempSrc: Oral  SpO2: 99%     General appearance: alert; no  distress Eyes: PERRL; EOMI; conjunctiva normal HENT: normocephalic; atraumatic; TMs normal, canal normal, external ears normal without trauma; nasal mucosa normal; oral mucosa normal Neck: supple, no adenopathy or localized tenderness, FROM Back: no CVA tenderness Extremities: no cyanosis or edema; symmetrical with no gross deformities Skin: warm and dry Neurologic: normal gait; grossly normal Psychological: alert and cooperative; normal mood and affect      Labs:  Results for orders placed or performed in visit on 01/31/16  Cytology - PAP  Result Value Ref Range   CYTOLOGY - PAP PAP RESULT     Labs Reviewed - No data to display  No results found.     ASSESSMENT & PLAN:  1. Acute strain of neck muscle, initial encounter    Gentle ROM of neck three times a day, consider massage and yoga or swimming  Reviewed expectations re: course of current medical issues. Questions answered. Outlined signs and symptoms indicating need for more acute intervention. Patient verbalized understanding. After Visit Summary given.    Procedures:      Elvina SidleLauenstein, Ryllie Nieland, MD 10/14/17 1905

## 2017-10-14 NOTE — ED Triage Notes (Signed)
Feels pressure in back of head and neck pressure.  Yesterday felt like right arm weakness.  Patient is left handed.

## 2017-11-02 ENCOUNTER — Emergency Department (HOSPITAL_COMMUNITY)
Admission: EM | Admit: 2017-11-02 | Discharge: 2017-11-03 | Disposition: A | Discharge status: Home / Self Care | Payer: 59 | Attending: Emergency Medicine | Admitting: Emergency Medicine

## 2017-11-02 ENCOUNTER — Emergency Department (HOSPITAL_COMMUNITY): Payer: 59

## 2017-11-02 ENCOUNTER — Encounter (HOSPITAL_COMMUNITY): Payer: Self-pay | Admitting: Emergency Medicine

## 2017-11-02 DIAGNOSIS — M5412 Radiculopathy, cervical region: Secondary | ICD-10-CM | POA: Diagnosis not present

## 2017-11-02 DIAGNOSIS — I10 Essential (primary) hypertension: Secondary | ICD-10-CM | POA: Insufficient documentation

## 2017-11-02 DIAGNOSIS — F17210 Nicotine dependence, cigarettes, uncomplicated: Secondary | ICD-10-CM | POA: Insufficient documentation

## 2017-11-02 DIAGNOSIS — Z79899 Other long term (current) drug therapy: Secondary | ICD-10-CM | POA: Diagnosis not present

## 2017-11-02 DIAGNOSIS — M79601 Pain in right arm: Secondary | ICD-10-CM | POA: Diagnosis present

## 2017-11-02 LAB — CBC WITH DIFFERENTIAL/PLATELET
Basophils Absolute: 0 10*3/uL (ref 0.0–0.1)
Basophils Relative: 0 %
Eosinophils Absolute: 0.2 10*3/uL (ref 0.0–0.7)
Eosinophils Relative: 2 %
HEMATOCRIT: 43.4 % (ref 36.0–46.0)
HEMOGLOBIN: 14.5 g/dL (ref 12.0–15.0)
LYMPHS PCT: 33 %
Lymphs Abs: 3.6 10*3/uL (ref 0.7–4.0)
MCH: 32.1 pg (ref 26.0–34.0)
MCHC: 33.4 g/dL (ref 30.0–36.0)
MCV: 96 fL (ref 78.0–100.0)
MONOS PCT: 5 %
Monocytes Absolute: 0.5 10*3/uL (ref 0.1–1.0)
NEUTROS PCT: 60 %
Neutro Abs: 6.7 10*3/uL (ref 1.7–7.7)
Platelets: 191 10*3/uL (ref 150–400)
RBC: 4.52 MIL/uL (ref 3.87–5.11)
RDW: 13.2 % (ref 11.5–15.5)
WBC: 11 10*3/uL — ABNORMAL HIGH (ref 4.0–10.5)

## 2017-11-02 LAB — D-DIMER, QUANTITATIVE: D-Dimer, Quant: <0.27 ug/mL-FEU (ref 0.00–0.50)

## 2017-11-02 LAB — I-STAT BETA HCG BLOOD, ED (MC, WL, AP ONLY): I-stat hCG, quantitative: <5 m[IU]/mL (ref <5)

## 2017-11-02 NOTE — ED Triage Notes (Addendum)
Pt reports "uncomfortable" feeling to R arm and into neck/head, denies pain. Ongoing for two weeks. Seen at urgent care for same. Concerned for blood clot.

## 2017-11-03 LAB — COMPREHENSIVE METABOLIC PANEL
ALT: 16 U/L (ref 14–54)
AST: 22 U/L (ref 15–41)
Albumin: 3.9 g/dL (ref 3.5–5.0)
Alkaline Phosphatase: 55 U/L (ref 38–126)
Anion gap: 9 (ref 5–15)
BUN: 11 mg/dL (ref 6–20)
CHLORIDE: 107 mmol/L (ref 101–111)
CO2: 20 mmol/L — AB (ref 22–32)
Calcium: 8.8 mg/dL — ABNORMAL LOW (ref 8.9–10.3)
Creatinine, Ser: 0.74 mg/dL (ref 0.44–1.00)
Glucose, Bld: 117 mg/dL — ABNORMAL HIGH (ref 65–99)
Potassium: 4 mmol/L (ref 3.5–5.1)
SODIUM: 136 mmol/L (ref 135–145)
Total Bilirubin: 0.6 mg/dL (ref 0.3–1.2)
Total Protein: 6.3 g/dL — ABNORMAL LOW (ref 6.5–8.1)

## 2017-11-03 MED ORDER — NAPROXEN 500 MG PO TABS: 500.0000 mg | ORAL_TABLET | Freq: Two times a day (BID) | ORAL | 0 refills | Status: DC

## 2017-11-03 MED ORDER — ORPHENADRINE CITRATE ER 100 MG PO TB12: 100.0000 mg | ORAL_TABLET | Freq: Two times a day (BID) | ORAL | 0 refills | Status: DC

## 2017-11-03 NOTE — ED Provider Notes (Signed)
[facility] Provider Note   CSN: [id number] Arrival date & time: 11/02/17  1834     History   Chief Complaint Chief Complaint  Patient presents with  . Arm Pain    HPI Laurie Camacho is a 32 y.o. female.  HPI She has had pain in the right arm and back of the neck for about 2 weeks.  She reports it feels uncomfortable around the base of her neck and the sides and then the right arm gets a aching and tingling feeling.  She reports it particularly gets worse when she smokes.  She reports she is worried about having a blood clot.  She is sure there is something wrong because she knows her body and this just has not been right.  Patient reports that she also feels sometimes a uncomfortable slightly sharp feeling on the right anterior chest.  No cough, no fever, no chills.,  No lower extremity symptoms.  No weakness numbness to the lower extremities.  She reports she was seen in urgent care and they told her she had neck strain but she sure that is not the problem.  Patient does work from home on a computer.  She has no other medical problems.  No history of DVT or PE. Past Medical History:  Diagnosis Date  . BIPOLAR AFFECTIVE DISORDER   . CHICKENPOX, HX OF   . DEPRESSION   . GENITAL HERPES   . HYPERTENSION   . MIGRAINE HEADACHE   . SMOKER     Patient Active Problem List   Diagnosis Date Noted  . Routine general medical examination at a health care facility 12/11/2014  . Dyslipidemia   . GENITAL HERPES 09/11/2009  . BIPOLAR AFFECTIVE DISORDER 09/11/2009  . SMOKER 09/11/2009    Past Surgical History:  Procedure Laterality Date  . NO PAST SURGERIES      OB History    No data available       Home Medications    Prior to Admission medications   Medication Sig Start Date End Date Taking? Authorizing Provider  naproxen (NAPROSYN) 500 MG tablet Take 1 tablet (500 mg total) by mouth 2 (two) times daily. 11/03/17   Arby BarrettePfeiffer, Nikolaus Pienta, MD    orphenadrine (NORFLEX) 100 MG tablet Take 1 tablet (100 mg total) by mouth 2 (two) times daily. 11/03/17   Arby BarrettePfeiffer, Jaina Morin, MD  valACYclovir (VALTREX) 1000 MG tablet Take 1,000 mg by mouth 2 (two) times daily.    [provider]    Family History Family History  Problem Relation Age of Onset  . Diabetes Other   . Hypertension Other   . Stroke Other     Social History Social History   Tobacco Use  . Smoking status: Current Every Day Smoker    Packs/day: 1.00    Types: Cigarettes  . Smokeless tobacco: Current User  . Tobacco comment: Single- lives with domestic partner. Employed @ [employer]  Substance Use Topics  . Alcohol use: No  . Drug use: No     Allergies   Patient has no known allergies.   Review of Systems Review of Systems 10 Systems reviewed and are negative for acute change except as noted in the HPI.   Physical Exam Updated Vital Signs BP 121/85 (BP Location: Left Arm)   Pulse 78   Temp 98.3 F (36.8 C) (Oral)   Resp 16   LMP  (LMP Unknown) Comment: reports irregular periods  SpO2 100%  Physical Exam  Constitutional: She is oriented to person, place, and time. She appears well-developed and well-nourished. No distress.  Patient is nontoxic and well in appearance.  She is anxious.  No respiratory distress.  HENT:  Head: Normocephalic and atraumatic.  Right Ear: External ear normal.  Left Ear: External ear normal.  Mouth/Throat: Oropharynx is clear and moist.  Eyes: Conjunctivae and EOM are normal.  Neck: Normal range of motion. Neck supple.  No meningismus.  No soft tissue anomaly.  No lymphadenopathy.  She does endorse tenderness along the trapezius on the right.  Cardiovascular: Normal rate, regular rhythm, normal heart sounds and intact distal pulses.  No murmur heard. Pulmonary/Chest: Effort normal and breath sounds normal. No respiratory distress.  Abdominal: Soft. She exhibits no distension. There is no  tenderness.  Musculoskeletal: Normal range of motion. She exhibits no edema or deformity.  Patient does not have any edema or soft tissue swelling or anomaly of the right upper extremity.  Range of motion is normal.  No swelling or anomaly are clavicle or the upper portion of the shoulder.  She does have reproducible discomfort to palpation of the muscular bodies of the trapezius in the paraspinous region.  Patient is neurovascularly intact.  Neurological: She is alert and oriented to person, place, and time. No cranial nerve deficit. She exhibits normal muscle tone. Coordination normal.  She has 5\5 muscular strength of both upper extremities that is symmetric.  This is for grip extension and flexion against resistance and deltoid resistance.  Skin: Skin is warm and dry.  Psychiatric: She has a normal mood and affect.  Nursing note and vitals reviewed.    ED Treatments / Results  Labs (all labs ordered are listed, but only abnormal results are displayed) Labs Reviewed  COMPREHENSIVE METABOLIC PANEL - Abnormal; Notable for the following components:      Result Value   CO2 20 (*)    Glucose, Bld 117 (*)    Calcium 8.8 (*)    Total Protein 6.3 (*)    All other components within normal limits  CBC WITH DIFFERENTIAL/PLATELET - Abnormal; Notable for the following components:   WBC 11.0 (*)    All other components within normal limits  D-DIMER, QUANTITATIVE (NOT AT [facility])  I-STAT BETA HCG BLOOD, ED ([facility], [facility], [facility] ONLY)    EKG  EKG Interpretation None       Radiology Dg Chest 2 View  Result Date: 11/02/2017 CLINICAL DATA:  Right-sided chest pain EXAM: CHEST  2 VIEW COMPARISON:  10/11/2007 radiograph FINDINGS: The heart size and mediastinal contours are within normal limits. Both lungs are clear. The visualized skeletal structures are unremarkable. IMPRESSION: No active cardiopulmonary disease. Electronically Signed   By: Jasmine Pang M.D.   On: 11/02/2017 23:22     Procedures Procedures (including critical care time)  Medications Ordered in ED Medications - No data to display   Initial Impression / Assessment and Plan / ED Course  I have reviewed the triage vital signs and the nursing notes.  Pertinent labs & imaging results that were available during my care of the patient were reviewed by me and considered in my medical decision making (see chart for details).      Final Clinical Impressions(s) / ED Diagnoses   Final diagnoses:  Cervical radiculopathy  Patient has completely normal neurologic function.  She has been concerned for pain into her right arm and across her shoulder and base of the neck.  She does not have risk  factor for DVT and d-dimer is negative.  Chest x-ray is normal.  Patient does not have other medical illnesses.  Findings by history and exam are consistent with cervical radiculopathy with no neurologic compromise.  Plan will be for naproxen and Norflex for pain control.  Patient is counseled on following up with her PCP.  ED Discharge Orders        Ordered    naproxen (NAPROSYN) 500 MG tablet  2 times daily     11/03/17 0019    orphenadrine (NORFLEX) 100 MG tablet  2 times daily     11/03/17 0019       Arby Barrette, MD 11/03/17 0041

## 2018-04-24 ENCOUNTER — Ambulatory Visit (HOSPITAL_COMMUNITY)
Admission: EM | Admit: 2018-04-24 | Discharge: 2018-04-24 | Disposition: A | Discharge status: Home / Self Care | Payer: 59 | Attending: Family Medicine | Admitting: Family Medicine

## 2018-04-24 ENCOUNTER — Other Ambulatory Visit: Payer: Self-pay

## 2018-04-24 ENCOUNTER — Encounter (HOSPITAL_COMMUNITY): Payer: Self-pay | Admitting: Emergency Medicine

## 2018-04-24 DIAGNOSIS — R51 Headache: Secondary | ICD-10-CM

## 2018-04-24 DIAGNOSIS — R519 Headache, unspecified: Secondary | ICD-10-CM

## 2018-04-24 DIAGNOSIS — R202 Paresthesia of skin: Secondary | ICD-10-CM

## 2018-04-24 DIAGNOSIS — R200 Anesthesia of skin: Secondary | ICD-10-CM

## 2018-04-24 MED ORDER — MELOXICAM 7.5 MG PO TABS: 7.5000 mg | ORAL_TABLET | Freq: Every day | ORAL | 0 refills | Status: DC

## 2018-04-24 NOTE — ED Provider Notes (Signed)
[facility]    CSN: [id number] Arrival date & time: 04/24/18  1214     History   Chief Complaint Chief Complaint  Patient presents with  . Hypertension    HPI Laurie Camacho is a 32 y.o. female.   32 year old female with history of bipolar affective disorder, depression, HTN, HLD comes in for worries of aneurysm, stroke.  States she has been smoking for the past 18 years, 1 to 2 packs/day, and quit cold [place] 5 days ago.  Since then, she has had pressure to the back of the head, and intermittent numbness and tingling of the right arm.  She denies any neck pain.  Denies injury/trauma.  Head pressure is not aggravated or alleviated by anything.  Denies photophobia, phonophobia.  Denies nausea, vomiting.  Right arm tingling is intermittent without aggravating or alleviating factor.  Able to move arm and fingers without problems.  She also feels that she has had "hallucinations" where when she first wakes up in the morning she feels as if she is still in a dream.  She denies hearing voices, seeing things.  Denies suicidal ideation, homicidal ideation.  She has not tried anything for the symptoms.  She is her worried about hypertension as she checked her blood pressure a few days ago and it read 128/90.     Past Medical History:  Diagnosis Date  . BIPOLAR AFFECTIVE DISORDER   . CHICKENPOX, HX OF   . DEPRESSION   . GENITAL HERPES   . HYPERTENSION   . MIGRAINE HEADACHE   . SMOKER     Patient Active Problem List   Diagnosis Date Noted  . Routine general medical examination at a health care facility 12/11/2014  . Dyslipidemia   . GENITAL HERPES 09/11/2009  . BIPOLAR AFFECTIVE DISORDER 09/11/2009  . SMOKER 09/11/2009    Past Surgical History:  Procedure Laterality Date  . NO PAST SURGERIES      OB History   None      Home Medications    Prior to Admission medications   Medication Sig Start Date End Date Taking? Authorizing Provider  meloxicam (MOBIC) 7.5  MG tablet Take 1 tablet (7.5 mg total) by mouth daily. 04/24/18   Belinda Fisher, PA-C    Family History Family History  Problem Relation Age of Onset  . Diabetes Other   . Hypertension Other   . Stroke Other     Social History Social History   Tobacco Use  . Smoking status: Current Every Day Smoker    Packs/day: 1.00    Types: Cigarettes  . Smokeless tobacco: Current User  . Tobacco comment: Single- lives with domestic partner. Employed @ [employer]  Substance Use Topics  . Alcohol use: No  . Drug use: No     Allergies   Patient has no known allergies.   Review of Systems Review of Systems  Reason unable to perform ROS: See HPI as above.     Physical Exam Triage Vital Signs ED Triage Vitals [04/24/18 1243]  Enc Vitals Group     BP 122/82     Pulse Rate 86     Resp 16     Temp 97.9 F (36.6 C)     Temp Source Oral     SpO2 100 %     Weight      Height      Head Circumference      Peak Flow      Pain Score  4     Pain Loc      Pain Edu?      Excl. in GC?    No data found.  Updated Vital Signs BP 122/82 (BP Location: Left Arm)   Pulse 86   Temp 97.9 F (36.6 C) (Oral)   Resp 16   SpO2 100%   Physical Exam  Constitutional: She is oriented to person, place, and time. She appears well-developed and well-nourished. No distress.  HENT:  Head: Normocephalic and atraumatic.  Right Ear: Tympanic membrane, external ear and ear canal normal. Tympanic membrane is not erythematous and not bulging.  Left Ear: Tympanic membrane, external ear and ear canal normal. Tympanic membrane is not erythematous and not bulging.  Nose: Nose normal. Right sinus exhibits no maxillary sinus tenderness and no frontal sinus tenderness. Left sinus exhibits no maxillary sinus tenderness and no frontal sinus tenderness.  Mouth/Throat: Uvula is midline, oropharynx is clear and moist and mucous membranes are normal.  Eyes: Pupils are equal, round, and reactive to  light. Conjunctivae and EOM are normal.  Neck: Normal range of motion. Neck supple. No spinous process tenderness and no muscular tenderness present. Normal range of motion present.  Cardiovascular: Normal rate, regular rhythm and normal heart sounds. Exam reveals no gallop and no friction rub.  No murmur heard. Pulmonary/Chest: Effort normal and breath sounds normal. No stridor. No respiratory distress. She has no decreased breath sounds. She has no wheezes. She has no rhonchi. She has no rales.  Lymphadenopathy:    She has no cervical adenopathy.  Neurological: She is alert and oriented to person, place, and time. She has normal strength. She is not disoriented. No cranial nerve deficit or sensory deficit. She displays a negative Romberg sign. Coordination and gait normal. GCS eye subscore is 4. GCS verbal subscore is 5. GCS motor subscore is 6.  Normal finger-to-nose, rapid movement.  Patient able to ambulate on and off exam table without difficulty or hesitancy.    Skin: Skin is warm and dry.  Psychiatric: She has a normal mood and affect. Her behavior is normal. Judgment and thought content normal. She is not actively hallucinating. Cognition and memory are normal. She expresses no suicidal plans and no homicidal plans. She is attentive.     UC Treatments / Results  Labs (all labs ordered are listed, but only abnormal results are displayed) Labs Reviewed - No data to display  EKG None  Radiology No results found.  Procedures Procedures (including critical care time)  Medications Ordered in UC Medications - No data to display  Initial Impression / Assessment and Plan / UC Course  I have reviewed the triage vital signs and the nursing notes.  Pertinent labs & imaging results that were available during my care of the patient were reviewed by me and considered in my medical decision making (see chart for details).    No alarming signs on exam.  Neurology exam grossly intact  without focal deficits.   Discussed with patient symptoms may be due to nicotine withdrawal.  Specifically discussed with patient intact neurology exam does not rule out aneurysm.  Patient expresses understanding.  Mobic to help with headache.  Return precautions given.  Patient expresses understanding and agrees to plan.  Final Clinical Impressions(s) / UC Diagnoses   Final diagnoses:  Acute intractable headache, unspecified headache type  Numbness and tingling of right arm    ED Prescriptions    Medication Sig Dispense Auth. Provider   meloxicam (MOBIC) 7.5  MG tablet Take 1 tablet (7.5 mg total) by mouth daily. 15 tablet Threasa Alpha, [place] 04/24/18 2123

## 2018-04-24 NOTE — ED Triage Notes (Signed)
[facility] a [facility] and "head pain" after stopping smoking 5 days ago.

## 2018-04-24 NOTE — Discharge Instructions (Signed)
As discussed, no alarming signs on exam. Start Mobic. Do not take ibuprofen (motrin/advil)/ naproxen (aleve) while on mobic. This can help with headache, decreasing inflammation that might be causing the numbness and tingling of your right arm.  As discussed, symptoms could be due to recent quitting of smoking.  Please follow-up with PCP to help cope with smoking.  If experiencing worsening symptoms, facial drooping, one-sided weakness, weakness, dizziness, confusion, hallucination, suicidal thoughts, plans, homicidal thoughts, plans, go to the emergency department for further evaluation.

## 2018-08-12 ENCOUNTER — Encounter: Payer: Self-pay | Admitting: Family Medicine

## 2018-08-12 ENCOUNTER — Encounter

## 2018-08-12 ENCOUNTER — Ambulatory Visit (INDEPENDENT_AMBULATORY_CARE_PROVIDER_SITE_OTHER): Payer: 59 | Admitting: Family Medicine

## 2018-08-12 ENCOUNTER — Other Ambulatory Visit: Payer: Self-pay

## 2018-08-12 VITALS — BP 122/80 | HR 81 | Temp 96.8°F | Resp 14 | Ht 62.5 in | Wt 166.2 lb

## 2018-08-12 DIAGNOSIS — Z716 Tobacco abuse counseling: Secondary | ICD-10-CM | POA: Diagnosis not present

## 2018-08-12 DIAGNOSIS — N926 Irregular menstruation, unspecified: Secondary | ICD-10-CM | POA: Diagnosis not present

## 2018-08-12 DIAGNOSIS — F17200 Nicotine dependence, unspecified, uncomplicated: Secondary | ICD-10-CM | POA: Diagnosis not present

## 2018-08-12 DIAGNOSIS — B009 Herpesviral infection, unspecified: Secondary | ICD-10-CM | POA: Insufficient documentation

## 2018-08-12 NOTE — Progress Notes (Signed)
Chief Complaint  Patient presents with  . Establish Care    No issues    HPI  Establish Care with New Provider Patient is new to the area and has not been in the [facility] before  She had a full physical with lipid panel and screening for diabetes that was also normal  Smoking Cessation She is a smoker She tried to quit smoking and felt like she was having withdrawal symptoms and felt like a nervous breakdown She is current cutting down on the nicotine She is smoking 1/2 pack down from a pack a day  She has been smoking for 16 years  Irregular Menses She reports a history of irregular menses She is off contraception for 2 years and is abstinent She states that she should continue to track her periods She states that she has gone more than 3 months with contraception She is not take medroxyprogesterone that GYN was suggesting because it was never called.     Past Medical History:  Diagnosis Date  . BIPOLAR AFFECTIVE DISORDER   . CHICKENPOX, HX OF   . DEPRESSION   . GENITAL HERPES   . HYPERTENSION   . MIGRAINE HEADACHE   . SMOKER     No current outpatient medications on file.   No current facility-administered medications for this visit.     Allergies: No Known Allergies  Past Surgical History:  Procedure Laterality Date  . NO PAST SURGERIES      Social History   Socioeconomic History  . Marital status: Single    Spouse name: Not on file  . Number of children: Not on file  . Years of education: Not on file  . Highest education level: Not on file  Occupational History  . Not on file  Social Needs  . Financial resource strain: Not on file  . Food insecurity:    Worry: Not on file    Inability: Not on file  . Transportation needs:    Medical: Not on file    Non-medical: Not on file  Tobacco Use  . Smoking status: Current Every Day Smoker    Packs/day: 0.50    Types: Cigarettes  . Smokeless tobacco: Current User  . Tobacco comment: Single- lives  with domestic partner. Employed @ [employer]  Substance and Sexual Activity  . Alcohol use: No  . Drug use: No  . Sexual activity: Yes    Birth control/protection: Injection  Lifestyle  . Physical activity:    Days per week: Not on file    Minutes per session: Not on file  . Stress: Not on file  Relationships  . Social connections:    Talks on phone: Not on file    Gets together: Not on file    Attends religious service: Not on file    Active member of club or organization: Not on file    Attends meetings of clubs or organizations: Not on file    Relationship status: Not on file  Other Topics Concern  . Not on file  Social History Narrative  . Not on file    Family History  Problem Relation Age of Onset  . Diabetes Other   . Hypertension Other   . Stroke Other      ROS Review of Systems See HPI Constitution: No fevers or chills No malaise No diaphoresis Skin: No rash or itching Eyes: no blurry vision, no double vision GU: no dysuria or hematuria Neuro: no dizziness or headaches all  others reviewed and negative   Objective: Vitals:   08/12/18 1013  BP: 122/80  Pulse: 81  Resp: 14  Temp: (!) 96.8 F (36 C)  TempSrc: Oral  SpO2: 98%  Weight: 166 lb 3.2 oz (75.4 kg)  Height: 5' 2.5" (1.588 m)  Body mass index is 29.91 kg/m.   Physical Exam  Constitutional: She is oriented to person, place, and time. She appears well-developed and well-nourished.  HENT:  Head: Normocephalic and atraumatic.  Eyes: Conjunctivae and EOM are normal.  Neck: Normal range of motion. Neck supple. No thyromegaly present.  Cardiovascular: Normal rate, regular rhythm and normal heart sounds.  No murmur heard. Pulmonary/Chest: Effort normal and breath sounds normal. No stridor. No respiratory distress. She has no wheezes.  Musculoskeletal: Normal range of motion. She exhibits no edema.  Neurological: She is alert and oriented to person, place, and time.    Skin: Skin is warm. Capillary refill takes less than 2 seconds.  Psychiatric: She has a normal mood and affect. Her behavior is normal. Judgment and thought content normal.     Assessment and Plan Laurie Camacho was seen today for establish care.  Diagnoses and all orders for this visit:  Irregular menses- discussed the menses Medroxyprogesterone was recommended by Gynecology Advised her to call Dr. Mikey Bussing  Tobacco use disorder-  Given that there was a history of depression and Bipolar disorder in the past pt should not do Chantix    Encounter for smoking cessation counseling -  Discussed nicotine patches -  Discussed ways to quit smoking and the hand-to-mouth movement addiction    Laurie Camacho A Creta Levin

## 2018-08-12 NOTE — Patient Instructions (Addendum)
Call Gynecology to discussed your irregular menses It says in her note that you should take medroxyprogesterone for periods that are absent for more than 3 months    If you have lab work done today you will be contacted with your lab results within the next 2 weeks.  If you have not heard from [place] then please contact us. The fastest way to get your results is to register for My Chart.   IF you received an x-ray today, you will receive an invoice from [facility]. Please contact [facility] at [phone] with questions or concerns regarding your invoice.   IF you received labwork today, you will receive an invoice from [facility]. Please contact [facility] at [phone] with questions or concerns regarding your invoice.   Our billing staff will not be able to assist you with questions regarding bills from these companies.  You will be contacted with the lab results as soon as they are available. The fastest way to get your results is to activate your My Chart account. Instructions are located on the last page of this paperwork. If you have not heard from [place] regarding the results in 2 weeks, please contact this office.      Steps to Quit Smoking Smoking tobacco can be harmful to your health and can affect almost every organ in your body. Smoking puts you, and those around you, at risk for developing many serious chronic diseases. Quitting smoking is difficult, but it is one of the best things that you can do for your health. It is never too late to quit. What are the benefits of quitting smoking? When you quit smoking, you lower your risk of developing serious diseases and conditions, such as:  Lung cancer or lung disease, such as COPD.  Heart disease.  Stroke.  Heart attack.  Infertility.  Osteoporosis and bone fractures.  Additionally, symptoms such as coughing, wheezing, and shortness of breath may get better when you quit. You may also find that you get sick  less often because your body is stronger at fighting off colds and infections. If you are pregnant, quitting smoking can help to reduce your chances of having a baby of low birth weight. How do I get ready to quit? When you decide to quit smoking, create a plan to make sure that you are successful. Before you quit:  Pick a date to quit. Set a date within the next two weeks to give you time to prepare.  Write down the reasons why you are quitting. Keep this list in places where you will see it often, such as on your bathroom mirror or in your car or wallet.  Identify the people, places, things, and activities that make you want to smoke (triggers) and avoid them. Make sure to take these actions: ? Throw away all cigarettes at home, at work, and in your car. ? Throw away smoking accessories, such as Set designer. ? Clean your car and make sure to empty the ashtray. ? Clean your home, including curtains and carpets.  Tell your family, friends, and coworkers that you are quitting. Support from your loved ones can make quitting easier.  Talk with your health care provider about your options for quitting smoking.  Find out what treatment options are covered by your health insurance.  What strategies can I use to quit smoking? Talk with your healthcare provider about different strategies to quit smoking. Some strategies include:  Quitting smoking altogether instead of gradually lessening how much  you smoke over a period of time. Research shows that quitting "cold [place]" is more successful than gradually quitting.  Attending in-person counseling to help you build problem-solving skills. You are more likely to have success in quitting if you attend several counseling sessions. Even short sessions of 10 minutes can be effective.  Finding resources and support systems that can help you to quit smoking and remain smoke-free after you quit. These resources are most helpful when you use them  often. They can include: ? Online chats with a Veterinary surgeon. ? Telephone quitlines. ? Automotive engineer. ? Support groups or group counseling. ? Text messaging programs. ? Mobile phone applications.  Taking medicines to help you quit smoking. (If you are pregnant or breastfeeding, talk with your health care provider first.) Some medicines contain nicotine and some do not. Both types of medicines help with cravings, but the medicines that include nicotine help to relieve withdrawal symptoms. Your health care provider may recommend: ? Nicotine patches, gum, or lozenges. ? Nicotine inhalers or sprays. ? Non-nicotine medicine that is taken by mouth.  Talk with your health care provider about combining strategies, such as taking medicines while you are also receiving in-person counseling. Using these two strategies together makes you more likely to succeed in quitting than if you used either strategy on its own. If you are pregnant or breastfeeding, talk with your health care provider about finding counseling or other support strategies to quit smoking. Do not take medicine to help you quit smoking unless told to do so by your health care provider. What things can I do to make it easier to quit? Quitting smoking might feel overwhelming at first, but there is a lot that you can do to make it easier. Take these important actions:  Reach out to your family and friends and ask that they support and encourage you during this time. Call telephone quitlines, reach out to support groups, or work with a counselor for support.  Ask people who smoke to avoid smoking around you.  Avoid places that trigger you to smoke, such as bars, parties, or smoke-break areas at work.  Spend time around people who do not smoke.  Lessen stress in your life, because stress can be a smoking trigger for some people. To lessen stress, try: ? Exercising regularly. ? Deep-breathing  exercises. ? Yoga. ? Meditating. ? Performing a body scan. This involves closing your eyes, scanning your body from head to toe, and noticing which parts of your body are particularly tense. Purposefully relax the muscles in those areas.  Download or purchase mobile phone or tablet apps (applications) that can help you stick to your quit plan by providing reminders, tips, and encouragement. There are many free apps, such as QuitGuide from the [employer] developer for Disease Control and Prevention). You can find other support for quitting smoking (smoking cessation) through smokefree.gov and other websites.  How will I feel when I quit smoking? Within the first 24 hours of quitting smoking, you may start to feel some withdrawal symptoms. These symptoms are usually most noticeable 2-3 days after quitting, but they usually do not last beyond 2-3 weeks. Changes or symptoms that you might experience include:  Mood swings.  Restlessness, anxiety, or irritation.  Difficulty concentrating.  Dizziness.  Strong cravings for sugary foods in addition to nicotine.  Mild weight gain.  Constipation.  Nausea.  Coughing or a sore throat.  Changes in how your medicines work in your body.  A depressed mood.  Difficulty sleeping (insomnia).  After the first 2-3 weeks of quitting, you may start to notice more positive results, such as:  Improved sense of smell and taste.  Decreased coughing and sore throat.  Slower heart rate.  Lower blood pressure.  Clearer skin.  The ability to breathe more easily.  Fewer sick days.  Quitting smoking is very challenging for most people. Do not get discouraged if you are not successful the first time. Some people need to make many attempts to quit before they achieve long-term success. Do your best to stick to your quit plan, and talk with your health care provider if you have any questions or concerns. This information is not intended to replace advice given  to you by your health care provider. Make sure you discuss any questions you have with your health care provider. Document Released: 09/30/2001 Document Revised: 06/03/2016 Document Reviewed: 02/20/2015 Elsevier Interactive Patient Education  [employer].

## 2019-07-09 IMAGING — DX DG CHEST 2V
2 series · 2 of 2 positions shown · non-contrast
Comparison: 10/11/2007 radiograph

CLINICAL DATA: Right-sided chest pain

EXAM:
CHEST  2 VIEW

[chest pa]
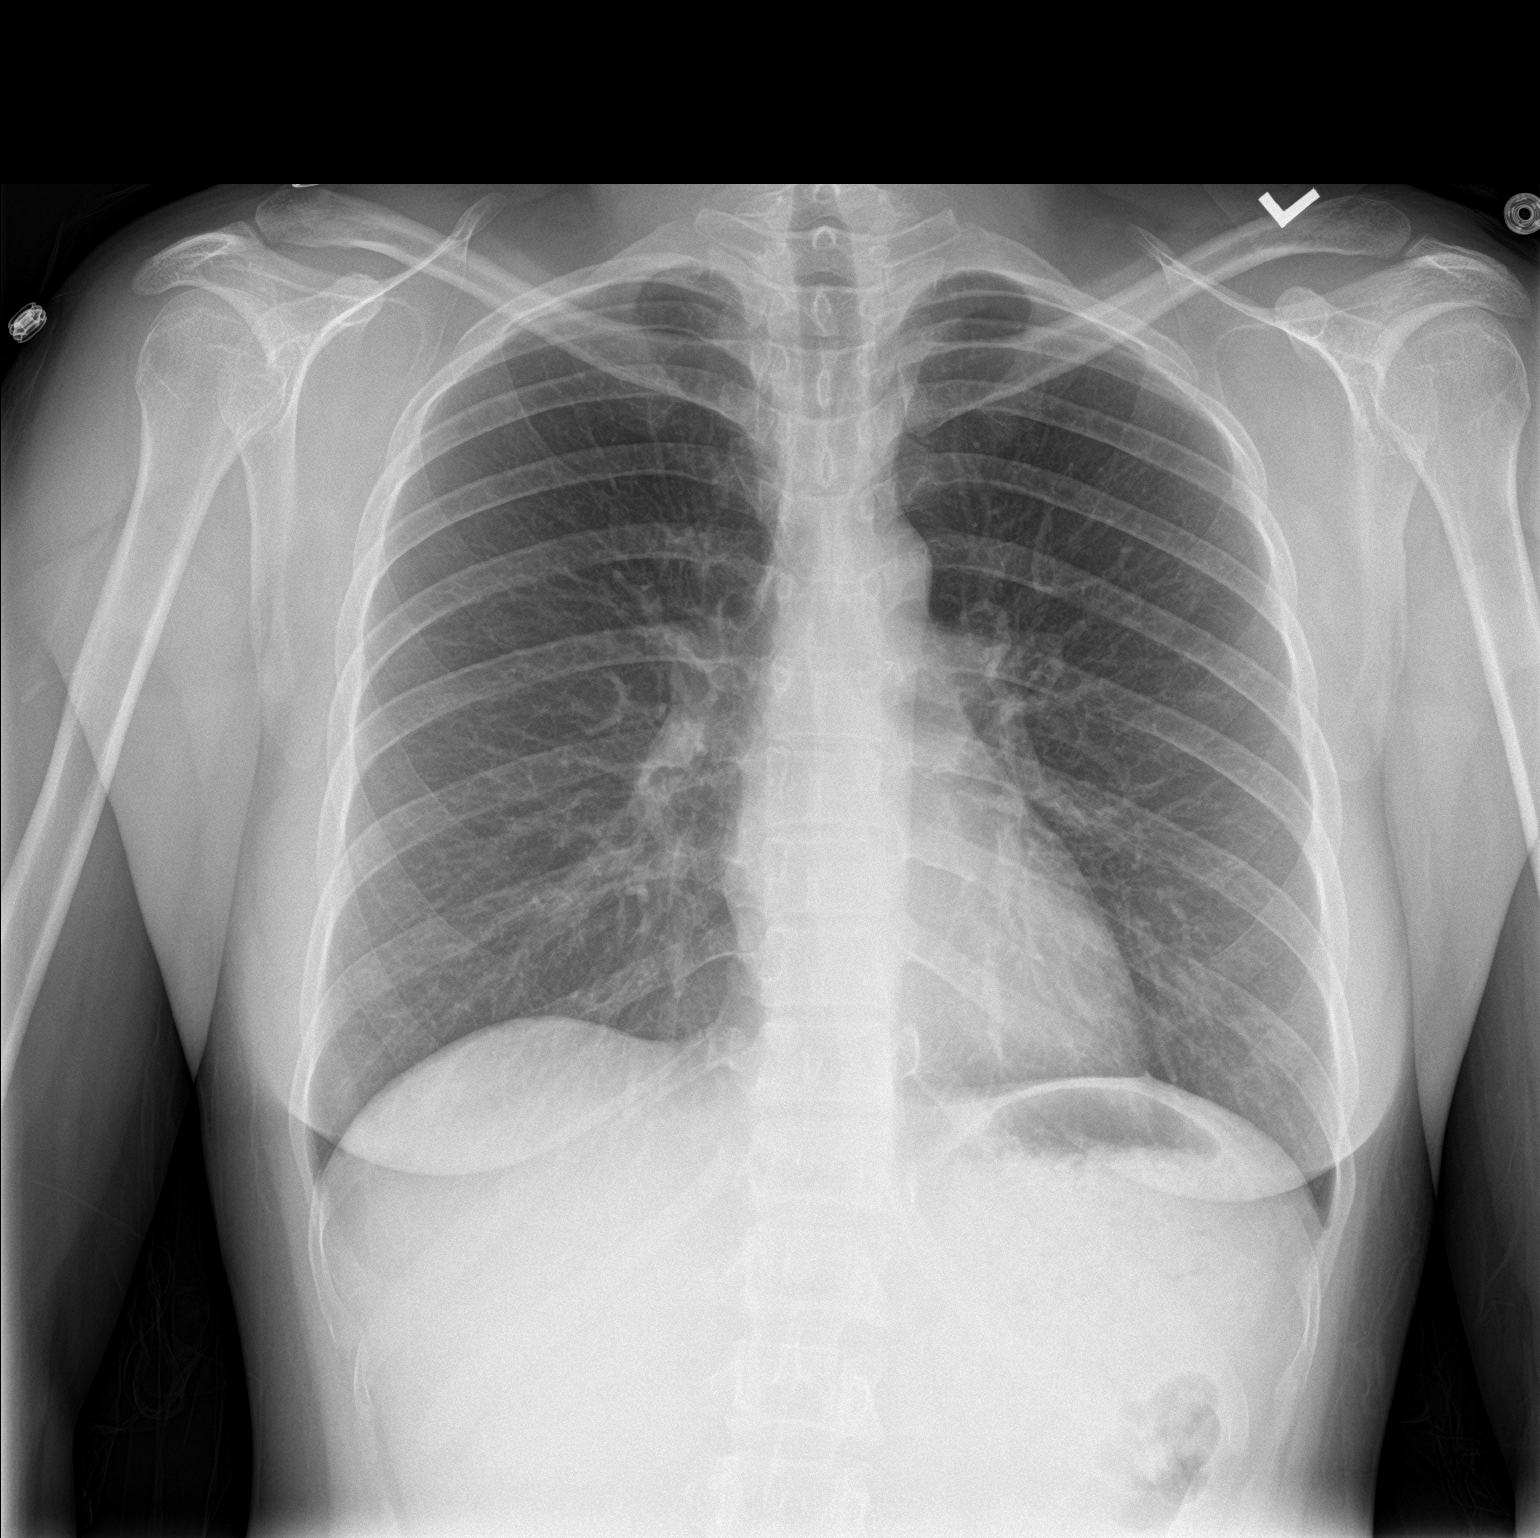

[chest lat]
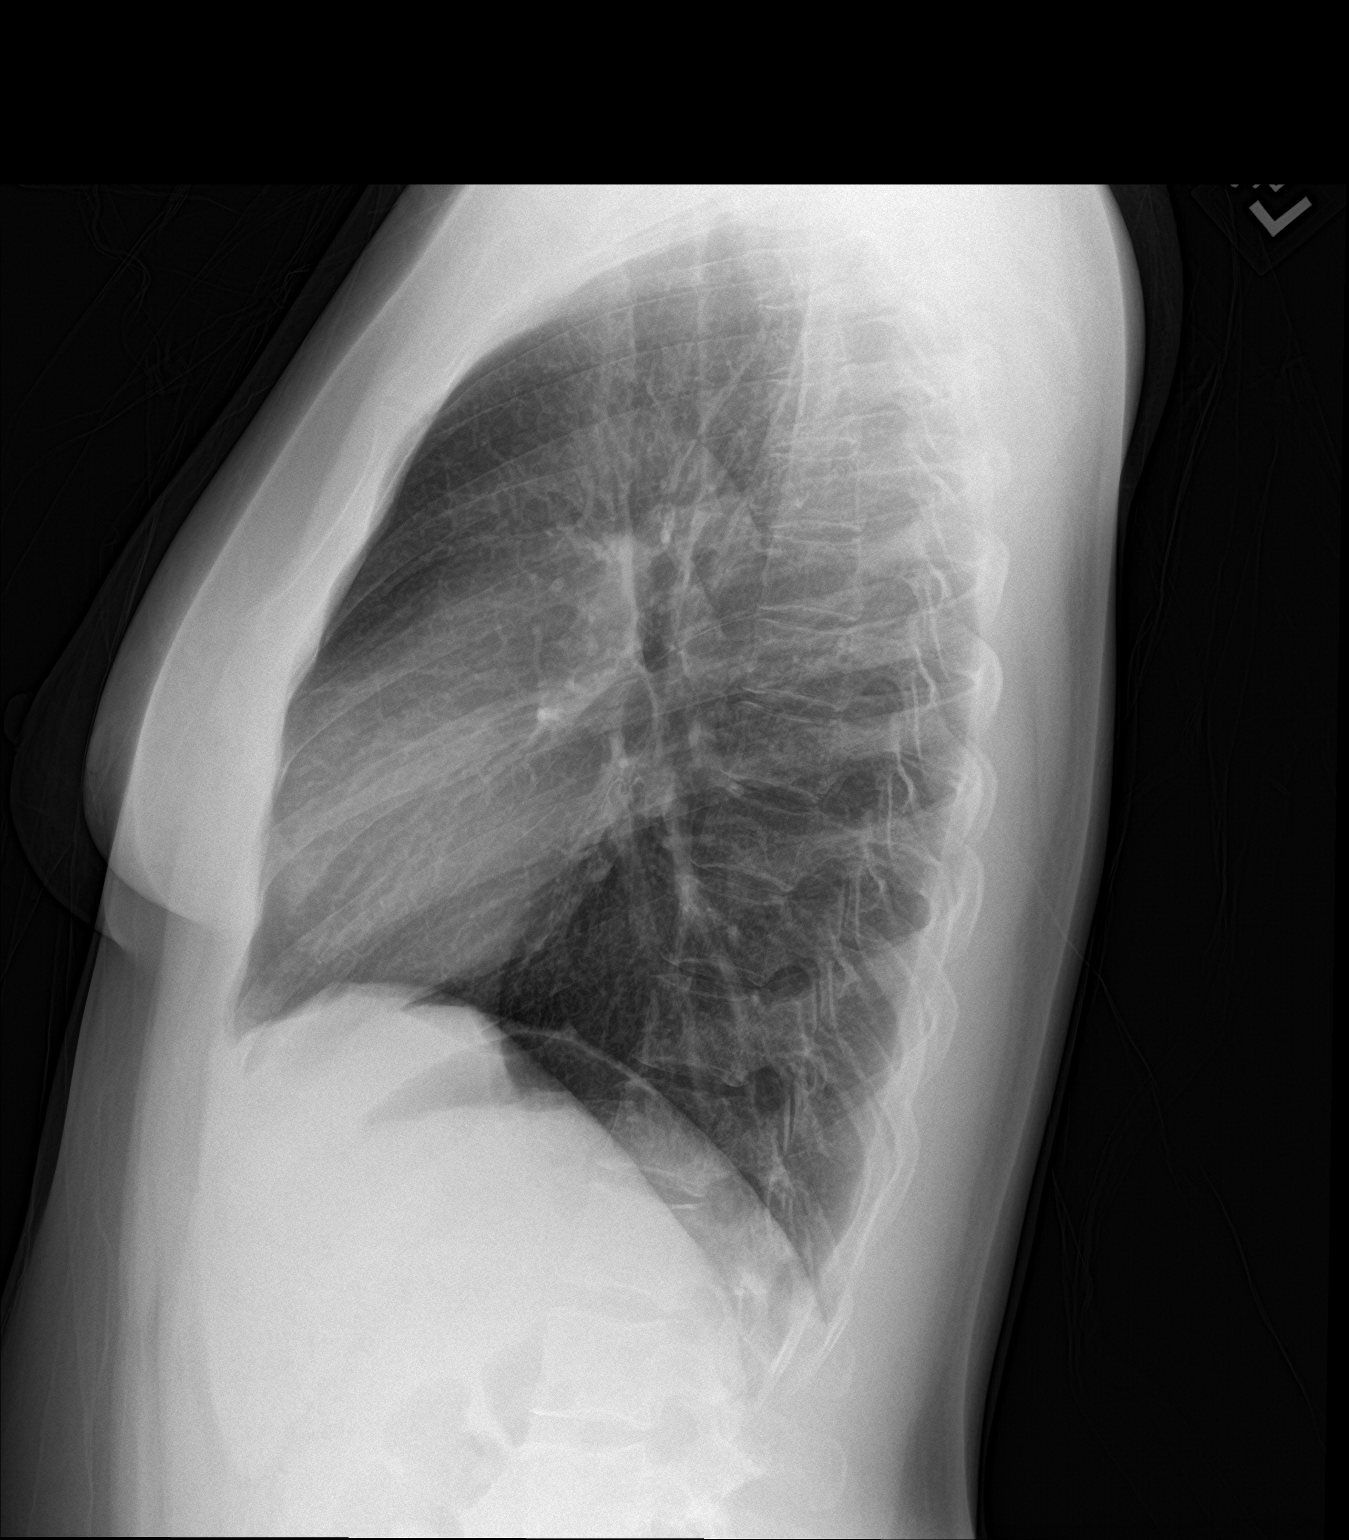

[2 of 2 positions shown; findings below may reference images not displayed]

FINDINGS: The heart size and mediastinal contours are within normal limits.
Both lungs are clear. The visualized skeletal structures are
unremarkable.
IMPRESSION: No active cardiopulmonary disease.

## 2020-01-26 ENCOUNTER — Ambulatory Visit: Payer: 59 | Attending: Internal Medicine
